# Patient Record
Sex: Female | Born: 1985 | Race: White | Hispanic: No | Marital: Married | Smoking: Never smoker
Health system: Southern US, Community
[De-identification: ages and names within clinical notes are randomized; demographics above are authoritative.]

## PROBLEM LIST (undated history)

## (undated) ENCOUNTER — Inpatient Hospital Stay (HOSPITAL_COMMUNITY): Payer: Self-pay

## (undated) DIAGNOSIS — Z9049 Acquired absence of other specified parts of digestive tract: Secondary | ICD-10-CM

## (undated) DIAGNOSIS — G43909 Migraine, unspecified, not intractable, without status migrainosus: Secondary | ICD-10-CM

## (undated) DIAGNOSIS — Z8669 Personal history of other diseases of the nervous system and sense organs: Secondary | ICD-10-CM

## (undated) DIAGNOSIS — O365990 Maternal care for other known or suspected poor fetal growth, unspecified trimester, not applicable or unspecified: Secondary | ICD-10-CM

## (undated) DIAGNOSIS — B977 Papillomavirus as the cause of diseases classified elsewhere: Secondary | ICD-10-CM

## (undated) DIAGNOSIS — Z9851 Tubal ligation status: Secondary | ICD-10-CM

## (undated) DIAGNOSIS — Z98891 History of uterine scar from previous surgery: Secondary | ICD-10-CM

## (undated) DIAGNOSIS — Z3483 Encounter for supervision of other normal pregnancy, third trimester: Secondary | ICD-10-CM

## (undated) DIAGNOSIS — K851 Biliary acute pancreatitis without necrosis or infection: Secondary | ICD-10-CM

## (undated) DIAGNOSIS — K5190 Ulcerative colitis, unspecified, without complications: Secondary | ICD-10-CM

---
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
## 2006-08-22 ENCOUNTER — Inpatient Hospital Stay (HOSPITAL_COMMUNITY): Admission: AD | Admit: 2006-08-22 | Discharge: 2006-08-26 | Payer: Self-pay | Admitting: Obstetrics and Gynecology

## 2006-09-07 ENCOUNTER — Encounter: Admission: RE | Admit: 2006-09-07 | Discharge: 2006-10-07 | Payer: Self-pay | Admitting: Obstetrics and Gynecology

## 2006-10-08 ENCOUNTER — Encounter: Admission: RE | Admit: 2006-10-08 | Discharge: 2006-11-06 | Payer: Self-pay | Admitting: Obstetrics and Gynecology

## 2006-11-07 ENCOUNTER — Encounter: Admission: RE | Admit: 2006-11-07 | Discharge: 2006-12-04 | Payer: Self-pay | Admitting: Obstetrics and Gynecology

## 2008-11-11 ENCOUNTER — Emergency Department: Payer: Self-pay | Admitting: Emergency Medicine

## 2009-10-07 ENCOUNTER — Ambulatory Visit: Payer: Self-pay | Admitting: Obstetrics & Gynecology

## 2010-06-08 NOTE — H&P (Signed)
[name], [name]               ACCOUNT NO.:  [id number]   MEDICAL RECORD NO.:  [id number]          PATIENT TYPE:  INP   LOCATION:  9166                          FACILITY:  [facility]   PHYSICIAN:  Zenaida Niece, M.D.DATE OF BIRTH:  1985/12/10   DATE OF ADMISSION:  08/22/2006  DATE OF DISCHARGE:                              HISTORY & PHYSICAL   CHIEF COMPLAINT:  Is possible intrauterine growth restriction and  decreased amniotic fluid volume.   HISTORY OF PRESENT ILLNESS:  This is a 25 year old white female gravida  1, para 0 with an EGA of [redacted] weeks who is admitted for cervical ripening  and induction due to a possible intrauterine growth restriction and low  amniotic fluid volume.  The patient started measuring size less than  dates significantly on July 15.  She had an ultrasound on July 21 which  revealed an estimated fetal weight of 2500 grams which was less than  10th percentile.  Amniotic fluid volume AFI at that point was 13.5 with  a depressed pocket of 4.3 cm.  She has been followed with reactive  nonstress tests.  She had a repeat ultrasound done for fluid volume on  July 28.  This reveals an AFI of 7.3 with a deepest pocket of 2.5 cm.  The baby is vertex.  On exam in the office today cervix is closed, thick  and high and NST is reactive.  Due to the small size of the baby as well  as a decreasing amniotic fluid volume.  I have recommended ripening and  induction and she is admitted for this at this time.  Prenatal care has  been essentially otherwise uncomplicated.   PRENATAL LABS:  Blood type is A+ with a negative antibody screen.  RPR  nonreactive, rubella immune, hepatitis B surface antigen negative, HIV  negative, gonorrhea and chlamydia negative, 1-hour Glucola is 86 and  group B strep is positive. Repeat gonorrhea and chlamydia at term is  negative, cystic fibrosis testing is also negative.   PAST GYN HISTORY:  Significant for a history of human papillomavirus.   PAST MEDICAL HISTORY:  History of ulcerative colitis.   PAST SURGICAL HISTORY:  Negative.   ALLERGIES:  None known.   CURRENT MEDICATIONS:  None.   NO FURTHER DICTATION      Zenaida Niece, M.D.     TDM/MEDQ  D:  08/22/2006  T:  08/23/2006  Job:  [id number]

## 2010-06-08 NOTE — Discharge Summary (Signed)
NAMELEYAN, BRANDEN               ACCOUNT NO.:  [id number]   MEDICAL RECORD NO.:  [id number]          PATIENT TYPE:  INP   LOCATION:  9116                          [facility]ACILITY:  [facility]   PHYSICIAN:  Malachi Pro. Ambrose Mantle, M.D. DATE OF BIRTH:  25-Oct-1985   DATE OF ADMISSION:  08/22/2006  DATE OF DISCHARGE:  08/26/2006                               DISCHARGE SUMMARY   A 25 year old  white female admitted for cervical ripening and induction  due to possible intrauterine growth restriction and low amniotic fluid  volume.  The patient underwent induction but stalled in cervical  dilatation at 3-4 cm and the occurrence of decelerations of the fetal  heart rate.  The patient underwent a low transverse cervical C-section  by Dr. Ellyn Hack  with epidural anesthesia and delivery of a 5 pound 2  ounces female infant with Apgars of nine at one and nine at 5 minutes.  She did well postoperatively,  she ambulated well, tolerated diet,  voided well, staples removed.  Strips were applied and on the third  postop day she was ready for discharge.   LABORATORY DATA:  Showed a initial hemoglobin of 12.4, hematocrit 35.9,  white count 11,800, platelet count 189,000.  Follow-up hemoglobin 11.6,  hematocrit 33.4.  RPR was nonreactive.   FINAL DIAGNOSES:  Intrauterine pregnancy 39 weeks delivered by C-  section.  A small for gestational age infant, low amniotic fluid volume,  failure to progress in labor.  Operation low transverse cervical C-  section.   FINAL CONDITION:  Improved.   DISCHARGE INSTRUCTIONS:  Instructions include our regular discharge instruction booklet.  The  patient is given a prescription for Percocet 5/325 40 tablets one or two  every 3-4 hours as needed for pain.  The patient is asked to return to  the office in 10-14 days for follow-up examination.      Malachi Pro. Ambrose Mantle, M.D.  Electronically Signed     TFH/MEDQ  D:  08/26/2006  T:  08/26/2006  Job:  [id number]

## 2010-06-08 NOTE — H&P (Signed)
[name], [name]               ACCOUNT NO.:  [id number]   MEDICAL RECORD NO.:  [id number]          PATIENT TYPE:  INP   LOCATION:  9166                          [facility]ILITY[facility]   PHYSICIAN:  Zenaida Niece, M.D.DATE OF BIRTH:  February 05, 1985   DATE OF ADMISSION:  08/22/2006  DATE OF DISCHARGE:                              HISTORY & PHYSICAL   CHIEF COMPLAINT:  Possible intrauterine growth restriction and decreased  amniotic fluid volume.   HISTORY OF PRESENT ILLNESS:  This is a 25 year old white female gravida  1, para 0 with an EGA of [redacted] weeks by a 13-week ultrasound with a due  date of August 2 who presented to the office today for follow-up of  possible IUGR and decreased amniotic fluid volume.  Prenatal care was  uncomplicated until July 15 where she measured small.  Ultrasound on  July 21 revealed estimated fetal weight of 2520 grams which was less  than 10th percentile.  Amniotic fluid index at that point was 13.5 with  a deepest pocket of 4.3 cm.  She has been followed with reactive  nonstress tests.  She had a follow-up ultrasound on July 28 which  revealed a decreased amniotic fluid index of 7.3 with a deepest pocket  of 2.5 cm.  She was seen today for a routine visit.  Her cervix is  closed, thick and high and she has no complaints.  Due to the small size  of the baby and decreasing amniotic fluid volume.  I have recommended  that she be admitted for cervical ripening and induction.  Again  prenatal care has been otherwise uncomplicated.   PRENATAL LABS:  Blood type is A+ with a negative antibody screen.  RPR  nonreactive, rubella immune, hepatitis B surface antigen negative, HIV  negative, gonorrhea and chlamydia negative.  1-hour Glucola is 86, group  B strep is negative and cystic fibrosis screen is negative.   PAST GYN HISTORY:  History of human papillomavirus.   PAST MEDICAL HISTORY:  History of ulcerative colitis.   PAST SURGICAL HISTORY:  None.   ALLERGIES:   None.   CURRENT MEDICATIONS:  None.   REVIEW OF SYSTEMS:  Is otherwise negative.   FAMILY HISTORY:  Is negative.   PHYSICAL EXAM:  VITAL SIGNS:  Weight is 200 pounds, blood pressure is  130/70.  GENERAL:  This is a gravid white female in no acute distress.  NECK:  Supple without lymphadenopathy or thyromegaly.  LUNGS:  Clear to auscultation.  HEART:  Regular rate and rhythm without murmur.  ABDOMEN:  Gravid with a fundal height of 35 cm.  Estimated fetal weight  of 5-6 pounds.  EXTREMITIES:  Have 1+ edema and are nontender.  PELVIC:  Cervix is closed, thick and high with a vertex presentation.   ASSESSMENT:  Intrauterine pregnancy at 39 weeks with possible IUGR and  decreasing amniotic fluid volume.  I have recommended the patient be  admitted for ripening and induction as her cervix is unfavorable.  The  plan is to admit the patient and ripen her cervix with Cervidil and  proceed with induction.  Zenaida Niece, M.D.  Electronically Signed     TDM/MEDQ  D:  08/22/2006  T:  08/23/2006  Job:  [id number]

## 2010-06-08 NOTE — Op Note (Signed)
[name], [name]               ACCOUNT NO.:  [id number]   MEDICAL RECORD NO.:  [id number]          PATIENT TYPE:  INP   LOCATION:  9116                          [facility]   PHYSICIAN:  Sherron Monday, MD        DATE OF BIRTH:  11/24/1985   DATE OF PROCEDURE:  08/23/2006  DATE OF DISCHARGE:                               OPERATIVE REPORT   PREOPERATIVE DIAGNOSIS:  Size less than dates, oligohydramnios, failure  to progress by arrest of dilatation.   POSTOPERATIVE DIAGNOSIS:  Size less than dates, oligohydramnios, failure  to progress by arrest of dilatation, delivered.   PROCEDURE:  Primary low transverse cesarean section.   SURGEON:  Sherron Monday, MD   ANESTHESIA:  Epidural.   COMPLICATIONS:  None.   PATHOLOGY:  None.   FINDINGS:  Viable female infant at 2114, with Apgars of 9 at one minute  and 9 at five minutes and a weight of 5 pounds 2 ounces, normal uterus,  tubes and ovaries.   ESTIMATED BLOOD LOSS:  500 mL.   IV FLUIDS:  2000 mL.   URINE OUTPUT:  200 mL clear fluid at the end of the procedure.   DISPOSITION:  Stable to PACU.   PROCEDURE:  After informed consent was reviewed with the patient  including risks, benefits and alternatives of cesarean section including  adequate contractions and arrest of dilatation as well as risks  including bleeding, infection, damage to surrounding organs, bowels,  bladder, ureters and blood vessels.  The patient was taken to the  operating room where epidural was redosed and found to be adequate.  She  was then prepped and draped in the normal sterile fashion.  Pfannenstiel  skin incision was made approximately 2 cm above the pubic symphysis  carried through the underlying layer of fascia sharply.  The fascia was  nicked in midline, the incision was extended laterally with Mayo  scissors.  The inferior aspect of the fascial incision was grasped with  Kocher clamps, elevated and the rectus muscles were dissected off both  bluntly and sharply.  Attention was then turned to the superior portion  of the incision which was grasped with Kocher clamps and the rectus  muscles were dissected off both bluntly and sharply.  Midline was easily  identified, the peritoneum was bluntly entered and the incision was  extended with good visualization with Metzenbaum scissors.  The Alexis C-  section retractor was then placed, placement was checked making sure no  bowel was trapped and the vesicouterine peritoneum was identified and  incised with Metzenbaum scissors and the bladder flap was created both  bluntly and with metzenbaum scissors.  The uterus was incised in  transverse fashion and the infant was delivered from the vertex  presentation.  A double nuchal cord was noted, the infant's nose and  mouth were suctioned on the field and handed off to awaiting pediatric  staff.  The placenta was expressed.  The uterus was cleared of all clots  and debris, closed in two layers of 0-0 Monocryl.  The first was a  running locked  suture, and the second was an imbricating layer.  Hemostasis was assured.  The peritoneal cavity was irrigated and the  Alexis retractor was removed.  Inspection of the subfascial planes  revealed they were hemostatic.  The fascia was closed with 0-0 Vicryl in  a running fashion.  Hemostasis was again assured subcuticular adipose  layer was made hemostatic with Bovie cautery.  A subcuticular adipose  layer of suture was placed.  Skin was closed with staples.  The patient  tolerated the procedure well.  Sponge, lap and needle counts were  correct x2 at the end of procedure.      Sherron Monday, MD  Electronically Signed     JB/MEDQ  D:  08/23/2006  T:  08/24/2006  Job:  [id number]

## 2010-06-11 NOTE — Consult Note (Signed)
NAMEMARGA, [name]                         ACCOUNT NO.:  [id number]   MEDICAL RECORD NO.:  [id number]                   PATIENT TYPE:  INP   LOCATION:  6127                                 FACILITY:  [facility]   PHYSICIAN:  Althea Grimmer. Luther Parody, M.D.            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  When she  initially presented to the hospital, she said it was a 9/10 in severity,  although it is somewhat less now.  Her pain is not associated with bowel  movements or relieved by bowel movements.  She has not been nauseated, other  than when she took the contrast for the CT scan that was performed today.  Reportedly, she had a prior CT scan that was negative.  The CT scan done  within the past 24 hours suggests some thickening of the right colon with  mesenteric lymph node enlargement.  She reports that she had onset of  diarrhea approximately one week ago and is having five loose, nonbloody  stools per day, at minimum.  She was evaluated by a gynecologist, by report,  and had a transvaginal ultrasound and a GYN checkup that was negative.  She  denies dysphagia, epigastric pain, weight loss.  She says she has had chills  and night sweats at home, but she has not measured her temperature.  Here in  the hospital, she has had a low-grade temperature.   PAST MEDICAL HISTORY:  Fairly unremarkable.  She does get headaches.  She  has been having some right knee discomfort.  Menstrual periods are regular,  and her last period was the end of March.   CURRENT MEDICATIONS:  1. Birth control pills.  2. Tylenol No. 3.  3. Benefiber.   ALLERGIES:  None reported.   FAMILY HISTORY:   Negative for inflammatory bowel disease.  She does have  grandparents who have had diverticulitis and a grandfather with colon  cancer.  There is no family history of Crohn's or ulcerative colitis.   SOCIAL HISTORY:  She is a high school student who reportedly does well in  school and does not seem to have a lot of social issues.   REVIEW OF SYSTEMS:  GENERAL:  No weight loss.  Positive for night sweats.  ENDOCRINE:  No known diabetes or thyroid problems.  SKIN:  No rash or  pruritus.  EYES:  No icterus or change in vision.  ENT:  No aphthous ulcers  or chronic sore throat.  RESPIRATORY:  No shortness of breath, cough, or  wheezing.  CARDIAC:  No chest pain, palpitations,  or history of valvular  heart disease.  GI:  As above.  GU:  No dysuria or hematuria.  Remainder of  review of systems is negative.   PHYSICAL EXAMINATION:  GENERAL:  She is a well-developed, uncomfortable-  appearing teenage girl, in no acute distress.  VITAL SIGNS:  Temperature is 100, blood pressure 114/70, pulse 90.  SKIN:  Normal.  EYES:  Anicteric.  OROPHARYNX:  Unremarkable without aphthous ulcers.  NECK:  Supple without thyromegaly.  There is no cervical or inguinal  adenopathy.  CHEST:  Clear.  HEART SOUNDS:  Regular rate and rhythm without murmurs, rubs, or gallops.  ABDOMEN:  Soft with mild bilateral lower quadrant tenderness.  There is no  mass or rebound.  Bowel sounds are normal.  RECTAL EXAM:  Not performed and is reported to be guaiac negative by the  resident.  EXTREMITIES:  Without clubbing, cyanosis, edema, or rash.   LABORATORY DATA:  Laboratory tests reveal a hemoglobin of 12.8, white blood  count is 7.7, but there is a leukocytosis, platelet count is mildly low at  114.  Sedimentation rate is 10, but C-reactive protein is elevated at 8.8.  Urinalysis is negative.  Potassium 3, BUN 7.  Liver function tests normal.  Stool reportedly contains white blood cells.   IMPRESSION:  A 25 year old  female with lower abdominal pain of several  months' duration with recent onset of diarrhea.  She has a mildly abnormal  computed tomography scan, suggesting inflammation of the right colon.  She  has white blood cells in the stool and an elevated C-reactive protein.  It  is doubtful that an infection would go on this long; however, _______ or  even bowel tuberculosis could present like this.  The most significant  diagnosis to rule out would be Crohn's disease or ulcerative colitis.  It is  my feeling that a colonoscopy is in order to sort this out.   PLAN:  Colonoscopy is discussed with the patient and her parents in terms of  technique, preparation, and risks and complications, including bleeding and  perforation; they agree to proceed, and it will be scheduled for tomorrow  afternoon.  Please see the orders.  Further recommendations will follow.                                               Althea Grimmer. Luther Parody, M.D.    PJS/MEDQ  D:  05/14/2002  T:  05/15/2002  Job:  [id number]   cc:   Orie Rout, M.D.  [address]  [place] [address]  Fax: [phone]   Windle Guard, M.D.  [address]  [place], [place] [address]  Fax: [phone]

## 2010-10-18 LAB — ANTIBODY SCREEN: Antibody Screen: NEGATIVE

## 2010-10-18 LAB — ABO/RH: RH Type: POSITIVE

## 2010-11-08 LAB — CCBB MATERNAL DONOR DRAW

## 2010-11-08 LAB — CBC
HCT: 35.9 — ABNORMAL LOW
MCV: 94
Platelets: 157
Platelets: 189
RDW: 12.4
RDW: 12.9
WBC: 11.8 — ABNORMAL HIGH
WBC: 15 — ABNORMAL HIGH

## 2010-11-08 LAB — RPR: RPR Ser Ql: NONREACTIVE

## 2011-01-20 LAB — HIV ANTIBODY (ROUTINE TESTING W REFLEX): HIV: NONREACTIVE

## 2011-01-25 DIAGNOSIS — Z9049 Acquired absence of other specified parts of digestive tract: Secondary | ICD-10-CM

## 2011-01-25 HISTORY — DX: Acquired absence of other specified parts of digestive tract: Z90.49

## 2011-03-29 ENCOUNTER — Encounter (HOSPITAL_COMMUNITY): Payer: Self-pay | Admitting: Pharmacist

## 2011-04-12 ENCOUNTER — Encounter (HOSPITAL_COMMUNITY): Payer: Self-pay

## 2011-04-12 ENCOUNTER — Other Ambulatory Visit (HOSPITAL_COMMUNITY): Payer: Self-pay

## 2011-04-13 ENCOUNTER — Encounter (HOSPITAL_COMMUNITY): Payer: Self-pay

## 2011-04-13 ENCOUNTER — Encounter (HOSPITAL_COMMUNITY)
Admission: RE | Admit: 2011-04-13 | Discharge: 2011-04-13 | Disposition: A | Discharge status: Home / Self Care | Payer: Medicaid Other | Source: Ambulatory Visit | Attending: Obstetrics and Gynecology | Admitting: Obstetrics and Gynecology

## 2011-04-13 LAB — CBC
HCT: 36.2 % (ref 36.0–46.0)
Hemoglobin: 12.1 g/dL (ref 12.0–15.0)
MCHC: 33.4 g/dL (ref 30.0–36.0)
MCV: 93.8 fL (ref 78.0–100.0)
RDW: 12.8 % (ref 11.5–15.5)

## 2011-04-13 NOTE — Patient Instructions (Addendum)
20 [name]  04/13/2011   Your procedure is scheduled on:  04/18/11  Enter through the Main Entrance of [facility] at 8 AM.  Pick up the phone at the desk and dial [phone].   Call this number if you have problems the morning of surgery: [phone]   Remember:   Do not eat food:After Midnight.  Do not drink clear liquids: After Midnight.  Take these medicines the morning of surgery with A SIP OF WATER: NA   Do not wear jewelry, make-up or nail polish.  Do not wear lotions, powders, or perfumes. You [place] wear deodorant.  Do not shave 48 hours prior to surgery.  Do not bring valuables to the hospital.  Contacts, dentures or bridgework [place] not be worn into surgery.  Leave suitcase in the car. After surgery it Eve be brought to your room.  For patients admitted to the hospital, checkout time is 11:00 AM the day of discharge.   Patients discharged the day of surgery will not be allowed to drive home.  Name and phone number of your driver: NA  Special Instructions: CHG Shower Use Special Wash: 1/2 bottle night before surgery and 1/2 bottle morning of surgery.   Please read over the following fact sheets that you were given: MRSA Information

## 2011-04-14 LAB — RPR: RPR Ser Ql: NONREACTIVE

## 2011-04-16 ENCOUNTER — Encounter (HOSPITAL_COMMUNITY): Payer: Self-pay | Admitting: Obstetrics and Gynecology

## 2011-04-16 DIAGNOSIS — O365990 Maternal care for other known or suspected poor fetal growth, unspecified trimester, not applicable or unspecified: Secondary | ICD-10-CM

## 2011-04-16 HISTORY — DX: Maternal care for other known or suspected poor fetal growth, unspecified trimester, not applicable or unspecified: O36.5990

## 2011-04-16 NOTE — H&P (Addendum)
Clifford Coudriet Adolph is a 26 y.o. female G2P1001 at 39+ for rLTCS.  Pt's pregnancy complicated by small for dates with growth at 17% and nl AFI, folllowed with weekly AFI and BW NST.  Also GBBS in Ur Cx.  +FM, no LOF< no VB, occ ctx.  D/W pt R/B/A of rLTCS, wishes to proceed.   Maternal Medical History:  Fetal activity: Perceived fetal activity is normal.    Prenatal complications: IUGR.     OB History    Grav Para Term Preterm Abortions TAB SAB Ect Mult Living   2 1 1       1     G1 term LTCS, oligo and small for dates, 5#2, G2 present; h/o abn pap, no h/o STDs Past Medical History  Diagnosis Date  . Ulcerative colitis   . Small for dates affecting management of mother 04/16/2011   Past Surgical History  Procedure Date  . Cesarean section 2008   Family History: Abnormal pap, COPD, CAD, DM, Diverticulitis, HTN, Migraine, MI Social History:  reports that she has never smoked. She does not have any smokeless tobacco history on file. She reports that she does not drink alcohol. Her drug history not on file.married Meds MVI All NKDA  Review of Systems  Constitutional: Negative.   HENT: Negative.   Eyes: Negative.   Respiratory: Negative.   Cardiovascular: Negative.   Gastrointestinal: Negative.   Genitourinary: Negative.   Musculoskeletal: Negative.   Skin: Negative.   Neurological: Negative.   Psychiatric/Behavioral: Negative.       Last menstrual period 07/16/2010. Maternal Exam:  Abdomen: Surgical scars: low transverse.   Fundal height is small for dates.   Estimated fetal weight is 5-6#.   Fetal presentation: vertex     Physical Exam  Constitutional: She is oriented to person, place, and time. She appears well-developed and well-nourished.  HENT:  Head: Normocephalic and atraumatic.  Neck: Normal range of motion. Neck supple. No thyromegaly present.  Cardiovascular: Normal rate and regular rhythm.   Respiratory: Effort normal and breath sounds normal. No respiratory  distress.  GI: Soft. Bowel sounds are normal. She exhibits no distension. There is no tenderness.  Musculoskeletal: Normal range of motion.  Neurological: She is alert and oriented to person, place, and time.  Skin: Skin is warm and dry.  Psychiatric: She has a normal mood and affect. Her behavior is normal.    Prenatal labs: ABO, Rh: A/Positive/-- (09/24 0000) Antibody: Negative (09/24 0000) Rubella: Immune (09/24 0000) RPR: NON REACTIVE (03/20 1153)  HBsAg: Negative (09/24 0000)  HIV: Non-reactive (12/27 0000)  GBS:   positive Hgb 13.2/Pap WNL/GBBS + in urine/ Plt 162K/ GC neg/ Chl neg/ First tri and AFP declined/ CF neg/ glucola 64  [place] nl anat, ant plac, previa [place] previa resolved [place] s<D, growth 17%,nl AFI  Assessment/Plan: 25yo G2P1001 at 39+ with small for dates pregnancy for rLTCS, after discussion of R/B/A will proceed 3/25   BOVARD,Bettyann Birchler 04/16/2011, 11:14 PM H&P reviewed, no changes.  Reviewed rLTCS inc R/B/A wil proceed.

## 2011-04-17 MED ORDER — CEFAZOLIN SODIUM-DEXTROSE 2-3 GM-% IV SOLR
2.0000 g | INTRAVENOUS | Status: AC
  Administered 2011-04-18: 2 g via INTRAVENOUS
  Filled 2011-04-17: qty 50

## 2011-04-18 ENCOUNTER — Encounter (HOSPITAL_COMMUNITY): Payer: Self-pay | Admitting: Anesthesiology

## 2011-04-18 ENCOUNTER — Inpatient Hospital Stay (HOSPITAL_COMMUNITY)
Admission: RE | Admit: 2011-04-18 | Discharge: 2011-04-20 | DRG: 765 | Disposition: A | Discharge status: Home / Self Care | Payer: Medicaid Other | Source: Ambulatory Visit | Attending: Obstetrics and Gynecology | Admitting: Obstetrics and Gynecology

## 2011-04-18 ENCOUNTER — Inpatient Hospital Stay (HOSPITAL_COMMUNITY): Payer: Medicaid Other | Admitting: Anesthesiology

## 2011-04-18 ENCOUNTER — Encounter (HOSPITAL_COMMUNITY): Payer: Self-pay | Admitting: *Deleted

## 2011-04-18 ENCOUNTER — Encounter (HOSPITAL_COMMUNITY): Payer: Self-pay | Admitting: Obstetrics and Gynecology

## 2011-04-18 ENCOUNTER — Encounter (HOSPITAL_COMMUNITY)
Admission: RE | Disposition: A | Discharge status: Home / Self Care | Payer: Self-pay | Source: Ambulatory Visit | Attending: Obstetrics and Gynecology

## 2011-04-18 DIAGNOSIS — Z22330 Carrier of Group B streptococcus: Secondary | ICD-10-CM

## 2011-04-18 DIAGNOSIS — O99892 Other specified diseases and conditions complicating childbirth: Secondary | ICD-10-CM | POA: Diagnosis present

## 2011-04-18 DIAGNOSIS — Z01818 Encounter for other preprocedural examination: Secondary | ICD-10-CM

## 2011-04-18 DIAGNOSIS — Z01812 Encounter for preprocedural laboratory examination: Secondary | ICD-10-CM

## 2011-04-18 DIAGNOSIS — Z98891 History of uterine scar from previous surgery: Secondary | ICD-10-CM

## 2011-04-18 DIAGNOSIS — O34219 Maternal care for unspecified type scar from previous cesarean delivery: Principal | ICD-10-CM | POA: Diagnosis present

## 2011-04-18 DIAGNOSIS — O365990 Maternal care for other known or suspected poor fetal growth, unspecified trimester, not applicable or unspecified: Secondary | ICD-10-CM

## 2011-04-18 HISTORY — DX: History of uterine scar from previous surgery: Z98.891

## 2011-04-18 HISTORY — DX: Maternal care for other known or suspected poor fetal growth, unspecified trimester, not applicable or unspecified: O36.5990

## 2011-04-18 SURGERY — Surgical Case
Anesthesia: Spinal

## 2011-04-18 MED ORDER — ZOLPIDEM TARTRATE 5 MG PO TABS: 5.0000 mg | ORAL_TABLET | Freq: Every evening | ORAL | Status: DC | PRN

## 2011-04-18 MED ORDER — EPHEDRINE SULFATE 50 MG/ML IJ SOLN
INTRAMUSCULAR | Status: DC | PRN
  Administered 2011-04-18 (×2): 10 mg via INTRAVENOUS

## 2011-04-18 MED ORDER — FENTANYL CITRATE 0.05 MG/ML IJ SOLN
INTRAMUSCULAR | Status: AC
  Filled 2011-04-18: qty 2

## 2011-04-18 MED ORDER — PRENATAL MULTIVITAMIN CH: 1.0000 | ORAL_TABLET | Freq: Every day | ORAL | Status: DC

## 2011-04-18 MED ORDER — OXYTOCIN 10 UNIT/ML IJ SOLN
INTRAMUSCULAR | Status: AC
  Filled 2011-04-18: qty 2

## 2011-04-18 MED ORDER — MORPHINE SULFATE (PF) 0.5 MG/ML IJ SOLN
INTRAMUSCULAR | Status: DC | PRN
  Administered 2011-04-18: .15 mg via INTRATHECAL

## 2011-04-18 MED ORDER — SCOPOLAMINE 1 MG/3DAYS TD PT72
1.0000 | MEDICATED_PATCH | Freq: Once | TRANSDERMAL | Status: DC
  Administered 2011-04-18: 1.5 mg via TRANSDERMAL

## 2011-04-18 MED ORDER — PHENYLEPHRINE 40 MCG/ML (10ML) SYRINGE FOR IV PUSH (FOR BLOOD PRESSURE SUPPORT)
PREFILLED_SYRINGE | INTRAVENOUS | Status: AC
  Filled 2011-04-18: qty 5

## 2011-04-18 MED ORDER — OXYCODONE-ACETAMINOPHEN 5-325 MG PO TABS
1.0000 | ORAL_TABLET | ORAL | Status: DC | PRN
  Administered 2011-04-19 – 2011-04-20 (×4): 1 via ORAL
  Filled 2011-04-18 (×4): qty 1

## 2011-04-18 MED ORDER — MORPHINE SULFATE 0.5 MG/ML IJ SOLN
INTRAMUSCULAR | Status: AC
  Filled 2011-04-18: qty 10

## 2011-04-18 MED ORDER — ONDANSETRON HCL 4 MG/2ML IJ SOLN: 4.0000 mg | Freq: Three times a day (TID) | INTRAMUSCULAR | Status: DC | PRN

## 2011-04-18 MED ORDER — DIPHENHYDRAMINE HCL 25 MG PO CAPS: 25.0000 mg | ORAL_CAPSULE | ORAL | Status: DC | PRN

## 2011-04-18 MED ORDER — LANOLIN HYDROUS EX OINT: 1.0000 "application " | TOPICAL_OINTMENT | CUTANEOUS | Status: DC | PRN

## 2011-04-18 MED ORDER — KETOROLAC TROMETHAMINE 30 MG/ML IJ SOLN: 30.0000 mg | Freq: Four times a day (QID) | INTRAMUSCULAR | Status: AC | PRN

## 2011-04-18 MED ORDER — TETANUS-DIPHTH-ACELL PERTUSSIS 5-2.5-18.5 LF-MCG/0.5 IM SUSP: 0.5000 mL | Freq: Once | INTRAMUSCULAR | Status: DC

## 2011-04-18 MED ORDER — NALOXONE HCL 0.4 MG/ML IJ SOLN
1.0000 ug/kg/h | INTRAMUSCULAR | Status: DC | PRN
  Filled 2011-04-18: qty 2.5

## 2011-04-18 MED ORDER — SENNOSIDES-DOCUSATE SODIUM 8.6-50 MG PO TABS
2.0000 | ORAL_TABLET | Freq: Every day | ORAL | Status: DC
  Administered 2011-04-18 – 2011-04-19 (×2): 2 via ORAL

## 2011-04-18 MED ORDER — DIPHENHYDRAMINE HCL 50 MG/ML IJ SOLN: 25.0000 mg | INTRAMUSCULAR | Status: DC | PRN

## 2011-04-18 MED ORDER — MENTHOL 3 MG MT LOZG: 1.0000 | LOZENGE | OROMUCOSAL | Status: DC | PRN

## 2011-04-18 MED ORDER — SODIUM CHLORIDE 0.9 % IJ SOLN: 3.0000 mL | INTRAMUSCULAR | Status: DC | PRN

## 2011-04-18 MED ORDER — KETOROLAC TROMETHAMINE 30 MG/ML IJ SOLN
INTRAMUSCULAR | Status: AC
  Filled 2011-04-18: qty 1

## 2011-04-18 MED ORDER — PHENYLEPHRINE HCL 10 MG/ML IJ SOLN
INTRAMUSCULAR | Status: DC | PRN
  Administered 2011-04-18 (×2): 80 ug via INTRAVENOUS
  Administered 2011-04-18: 40 ug via INTRAVENOUS
  Administered 2011-04-18: 80 ug via INTRAVENOUS

## 2011-04-18 MED ORDER — NALBUPHINE HCL 10 MG/ML IJ SOLN
5.0000 mg | INTRAMUSCULAR | Status: DC | PRN
  Filled 2011-04-18: qty 1

## 2011-04-18 MED ORDER — METOCLOPRAMIDE HCL 5 MG/ML IJ SOLN: 10.0000 mg | Freq: Three times a day (TID) | INTRAMUSCULAR | Status: DC | PRN

## 2011-04-18 MED ORDER — NALOXONE HCL 0.4 MG/ML IJ SOLN: 0.4000 mg | INTRAMUSCULAR | Status: DC | PRN

## 2011-04-18 MED ORDER — ONDANSETRON HCL 4 MG/2ML IJ SOLN
INTRAMUSCULAR | Status: AC
  Filled 2011-04-18: qty 2

## 2011-04-18 MED ORDER — DIBUCAINE 1 % RE OINT: 1.0000 "application " | TOPICAL_OINTMENT | RECTAL | Status: DC | PRN

## 2011-04-18 MED ORDER — KETOROLAC TROMETHAMINE 30 MG/ML IJ SOLN
INTRAMUSCULAR | Status: DC | PRN
  Administered 2011-04-18: 30 mg via INTRAVENOUS

## 2011-04-18 MED ORDER — BUPIVACAINE IN DEXTROSE 0.75-8.25 % IT SOLN
INTRATHECAL | Status: DC | PRN
  Administered 2011-04-18: 1.5 mL via INTRATHECAL

## 2011-04-18 MED ORDER — PRENATAL MULTIVITAMIN CH
1.0000 | ORAL_TABLET | Freq: Every morning | ORAL | Status: DC
  Administered 2011-04-19 – 2011-04-20 (×2): 1 via ORAL
  Filled 2011-04-18 (×2): qty 1

## 2011-04-18 MED ORDER — FENTANYL CITRATE 0.05 MG/ML IJ SOLN: 25.0000 ug | INTRAMUSCULAR | Status: DC | PRN

## 2011-04-18 MED ORDER — IBUPROFEN 600 MG PO TABS
600.0000 mg | ORAL_TABLET | Freq: Four times a day (QID) | ORAL | Status: DC | PRN
  Filled 2011-04-18: qty 1

## 2011-04-18 MED ORDER — DIPHENHYDRAMINE HCL 25 MG PO CAPS: 25.0000 mg | ORAL_CAPSULE | Freq: Four times a day (QID) | ORAL | Status: DC | PRN

## 2011-04-18 MED ORDER — LACTATED RINGERS IV SOLN
INTRAVENOUS | Status: DC
  Administered 2011-04-18: 125 mL/h via INTRAVENOUS

## 2011-04-18 MED ORDER — ONDANSETRON HCL 4 MG/2ML IJ SOLN: 4.0000 mg | INTRAMUSCULAR | Status: DC | PRN

## 2011-04-18 MED ORDER — OXYTOCIN 20 UNITS IN LACTATED RINGERS INFUSION - SIMPLE: 125.0000 mL/h | INTRAVENOUS | Status: AC

## 2011-04-18 MED ORDER — DEXAMETHASONE SODIUM PHOSPHATE 10 MG/ML IJ SOLN
INTRAMUSCULAR | Status: AC
  Filled 2011-04-18: qty 1

## 2011-04-18 MED ORDER — ONDANSETRON HCL 4 MG PO TABS: 4.0000 mg | ORAL_TABLET | ORAL | Status: DC | PRN

## 2011-04-18 MED ORDER — SIMETHICONE 80 MG PO CHEW: 80.0000 mg | CHEWABLE_TABLET | ORAL | Status: DC | PRN

## 2011-04-18 MED ORDER — FENTANYL CITRATE 0.05 MG/ML IJ SOLN
INTRAMUSCULAR | Status: DC | PRN
  Administered 2011-04-18: 25 ug via INTRATHECAL

## 2011-04-18 MED ORDER — SIMETHICONE 80 MG PO CHEW
80.0000 mg | CHEWABLE_TABLET | Freq: Three times a day (TID) | ORAL | Status: DC
  Administered 2011-04-18 – 2011-04-20 (×6): 80 mg via ORAL

## 2011-04-18 MED ORDER — ONDANSETRON HCL 4 MG/2ML IJ SOLN
INTRAMUSCULAR | Status: DC | PRN
  Administered 2011-04-18: 4 mg via INTRAVENOUS

## 2011-04-18 MED ORDER — LACTATED RINGERS IV SOLN
INTRAVENOUS | Status: DC
Start: 2011-04-18 — End: 2011-04-18
  Administered 2011-04-18: 08:00:00 via INTRAVENOUS

## 2011-04-18 MED ORDER — MEPERIDINE HCL 25 MG/ML IJ SOLN
INTRAMUSCULAR | Status: AC
  Filled 2011-04-18: qty 1

## 2011-04-18 MED ORDER — DIPHENHYDRAMINE HCL 50 MG/ML IJ SOLN: 12.5000 mg | INTRAMUSCULAR | Status: DC | PRN

## 2011-04-18 MED ORDER — OXYTOCIN 20 UNITS IN LACTATED RINGERS INFUSION - SIMPLE
INTRAVENOUS | Status: DC | PRN
  Administered 2011-04-18 (×2): 20 [IU] via INTRAVENOUS

## 2011-04-18 MED ORDER — IBUPROFEN 800 MG PO TABS
800.0000 mg | ORAL_TABLET | Freq: Three times a day (TID) | ORAL | Status: DC
  Administered 2011-04-18 – 2011-04-20 (×5): 800 mg via ORAL
  Filled 2011-04-18 (×5): qty 1

## 2011-04-18 MED ORDER — MEPERIDINE HCL 25 MG/ML IJ SOLN: 6.2500 mg | INTRAMUSCULAR | Status: DC | PRN

## 2011-04-18 MED ORDER — DEXAMETHASONE SODIUM PHOSPHATE 10 MG/ML IJ SOLN
INTRAMUSCULAR | Status: DC | PRN
  Administered 2011-04-18: 10 mg via INTRAVENOUS

## 2011-04-18 MED ORDER — WITCH HAZEL-GLYCERIN EX PADS: 1.0000 "application " | MEDICATED_PAD | CUTANEOUS | Status: DC | PRN

## 2011-04-18 SURGICAL SUPPLY — 28 items
CHLORAPREP W/TINT 26ML (MISCELLANEOUS) ×2 IMPLANT
CLOTH BEACON ORANGE TIMEOUT ST (SAFETY) ×2 IMPLANT
CONTAINER PREFILL 10% NBF 15ML (MISCELLANEOUS) IMPLANT
ELECT REM PT RETURN 9FT ADLT (ELECTROSURGICAL) ×2
ELECTRODE REM PT RTRN 9FT ADLT (ELECTROSURGICAL) ×1 IMPLANT
EXTRACTOR VACUUM M CUP 4 TUBE (SUCTIONS) IMPLANT
GLOVE BIO SURGEON STRL SZ 6.5 (GLOVE) ×2 IMPLANT
GLOVE BIO SURGEON STRL SZ7 (GLOVE) ×2 IMPLANT
GOWN PREVENTION PLUS LG XLONG (DISPOSABLE) ×6 IMPLANT
KIT ABG SYR 3ML LUER SLIP (SYRINGE) IMPLANT
NEEDLE HYPO 25X5/8 SAFETYGLIDE (NEEDLE) IMPLANT
NS IRRIG 1000ML POUR BTL (IV SOLUTION) ×2 IMPLANT
PACK C SECTION WH (CUSTOM PROCEDURE TRAY) ×2 IMPLANT
RTRCTR C-SECT PINK 25CM LRG (MISCELLANEOUS) ×2 IMPLANT
SLEEVE SCD COMPRESS KNEE MED (MISCELLANEOUS) IMPLANT
STAPLER VISISTAT 35W (STAPLE) IMPLANT
SUT MNCRL 0 VIOLET CTX 36 (SUTURE) ×2 IMPLANT
SUT MONOCRYL 0 CTX 36 (SUTURE) ×2
SUT PLAIN 1 NONE 54 (SUTURE) IMPLANT
SUT PLAIN 2 0 XLH (SUTURE) ×2 IMPLANT
SUT VIC AB 0 CT1 27 (SUTURE) ×2
SUT VIC AB 0 CT1 27XBRD ANBCTR (SUTURE) ×2 IMPLANT
SUT VIC AB 2-0 CT1 27 (SUTURE) ×1
SUT VIC AB 2-0 CT1 TAPERPNT 27 (SUTURE) ×1 IMPLANT
SYR BULB IRRIGATION 50ML (SYRINGE) ×2 IMPLANT
TOWEL OR 17X24 6PK STRL BLUE (TOWEL DISPOSABLE) ×4 IMPLANT
TRAY FOLEY CATH 14FR (SET/KITS/TRAYS/PACK) IMPLANT
WATER STERILE IRR 1000ML POUR (IV SOLUTION) ×2 IMPLANT

## 2011-04-18 NOTE — Anesthesia Postprocedure Evaluation (Signed)
  Anesthesia Post-op Note  Patient: [name]  Procedure(s) Performed: Procedure(s) (LRB): CESAREAN SECTION (N/A)  Patient Location: PACU  Anesthesia Type: Spinal  Level of Consciousness: awake, alert  and oriented  Airway and Oxygen Therapy: Patient Spontanous Breathing  Post-op Pain: none  Post-op Assessment: Post-op Vital signs reviewed, Patient's Cardiovascular Status Stable, Respiratory Function Stable, Patent Airway, No signs of Nausea or vomiting, Pain level controlled, No headache and No backache  Post-op Vital Signs: Reviewed and stable  Complications: No apparent anesthesia complications

## 2011-04-18 NOTE — Op Note (Signed)
[name], [name]                  ACCOUNT NO.:  [id number]  MEDICAL RECORD NO.:  [id number]  LOCATION:  [facility]                          FACILITY:  [facility]  PHYSICIAN:  Sherron Monday, MD        DATE OF BIRTH:  January 24, 1986  DATE OF PROCEDURE:  04/18/2011 DATE OF DISCHARGE:                              OPERATIVE REPORT   PREOPERATIVE DIAGNOSES: 1. Intrauterine pregnancy at 39 weeks. 2. History of low-transverse cesarean section. 3. Declined vaginal birth after cesarean.  POSTOPERATIVE DIAGNOSES: 1. Intrauterine pregnancy at 39 weeks. 2. History of low-transverse cesarean section. 3. Declined vaginal birth after cesarean. 4. Delivered.  PROCEDURE:  Repeat low transverse cesarean section.  SURGEON:  Sherron Monday, MD.  ASSISTANT:  Zenaida Niece, MD.  ANESTHESIA:  Spinal.  ESTIMATED BLOOD LOSS:  750.  IV FLUIDS:  3000.  URINE OUTPUT:  600 mL at the end of the case with clear urine.  SPECIMENS:  Placenta, cord blood in Labor and Delivery.  COMPLICATIONS:  None.  FINDINGS:  Viable female infant born at 9:48 with Apgars 8 at 1 minute and 9 at 5 minutes, and weight pending at the time of dictation.  Normal uterus, tubes, and ovaries with small filmy adhesions.  PROCEDURE IN DETAIL:  After informed consent was reviewed with the patient including risks, benefits, and alternatives of the surgical procedure, she was transported to the operating room, where spinal anesthesia was placed and found to be adequate, she was then placed on the table in supine position with a leftward tilt, prepped and draped in the normal sterile fashion.  Foley catheter was sterilely placed.  Her Pfannenstiel skin incision was made at the level of her previous incision, 2 fingerbreadths above the pubic symphysis, carried through the underlying layer of fascia sharply.  The fascia was incised in the midline.  Incision was extended laterally with Mayo scissors.  Inferior aspect of the fascial incision was  grasped with Kocher clamps and elevated.  Rectus muscles were dissected off both bluntly and sharply.  Attention was then turned to the superior portion, which in a similar fashion was grasped with Kocher clamps and elevated.  The rectus muscles were dissected off both bluntly and sharply.  Peritoneum was entered bluntly.  Incision extended superiorly and inferiorly with good visualization of the bladder.  The Alexis skin retractor was placed carefully making sure no bowel was entrapped.  The uterus was examined. The vesicouterine peritoneum was easily identified and tented up.  A bladder flap was created with smooth pickups.  With blunt and sharp dissection, uterus was incised in a transverse fashion.  Infant was delivered from a vertex presentation.  Nose and mouth were suctioned on the field.  Cord was clamped and cut.  Infant was handed off to the awaiting pediatric staff.  The placenta was expressed from the uterus. The uterus was cleared of all clots and debris.  Uterine incision was closed in 2 layers of 0 Monocryl, the first of which was running locked and the second as an imbricating layer.  The incision was made hemostatic with figure-of-eight of 0 Vicryl.  Copious pelvic irrigation was performed.  The [employer] skin  retractor was removed and the peritoneum was reapproximated after subfascial planes were inspected and found to be hemostatic.  The peritoneum was reapproximated with 2-0 Vicryl in a running fashion.  The fascia was closed with 0 Vicryl in a running fashion from either edge meeting in the middle.  The subcuticular adipose layer was irrigated and made hemostatic with Bovie cautery.  The subcuticular adipose tissue dead space was closed with 3-0 plain gut and the skin was closed with 3-0 Vicryl in the subcuticular fashion. Benzoin and Steri-Strips were applied.  The patient tolerated the procedure well.  Sponge, lap, and needle counts were correct x2 per the operating  room staff.     Sherron Monday, MD     JB/MEDQ  D:  04/18/2011  T:  04/18/2011  Job:  [id number]

## 2011-04-18 NOTE — Anesthesia Postprocedure Evaluation (Signed)
  Anesthesia Post-op Note  Patient: [name]  Procedure(s) Performed: Procedure(s) (LRB): CESAREAN SECTION (N/A)  Patient Location: Mother/Baby  Anesthesia Type: Spinal  Level of Consciousness: awake  Airway and Oxygen Therapy: Patient Spontanous Breathing  Post-op Pain: mild  Post-op Assessment: Post-op Vital signs reviewed  Post-op Vital Signs: Reviewed and stable  Complications: No apparent anesthesia complications

## 2011-04-18 NOTE — Anesthesia Preprocedure Evaluation (Addendum)
Anesthesia Evaluation    Airway Mallampati: I TM Distance: >3 FB Neck ROM: Full    Dental No notable dental hx. (+) Teeth Intact   Pulmonary neg pulmonary ROS,    Pulmonary exam normal       Cardiovascular negative cardio ROS  Rhythm:Regular Rate:Normal     Neuro/Psych negative neurological ROS  negative psych ROS   GI/Hepatic Neg liver ROS, Ulcerative colitis   Endo/Other  Morbid obesity  Renal/GU negative Renal ROS  negative genitourinary   Musculoskeletal   Abdominal Normal abdominal exam  (+) + obese,   Peds  Hematology negative hematology ROS (+)   Anesthesia Other Findings   Reproductive/Obstetrics (+) Pregnancy                          Anesthesia Physical Anesthesia Plan  ASA: III  Anesthesia Plan: Spinal   Post-op Pain Management:    Induction:   Airway Management Planned: Natural Airway  Additional Equipment:   Intra-op Plan:   Post-operative Plan:   Informed Consent: I have reviewed the patients History and Physical, chart, labs and discussed the procedure including the risks, benefits and alternatives for the proposed anesthesia with the patient or authorized representative who has indicated his/her understanding and acceptance.     Plan Discussed with: Anesthesiologist, Surgeon and CRNA  Anesthesia Plan Comments:         Anesthesia Quick Evaluation

## 2011-04-18 NOTE — Addendum Note (Signed)
Addendum  created 04/18/11 1721 by Algis Greenhouse, CRNA   Modules edited:Notes Section

## 2011-04-18 NOTE — Transfer of Care (Signed)
Immediate Anesthesia Transfer of Care Note  Patient: [name]  Procedure(s) Performed: Procedure(s) (LRB): CESAREAN SECTION (N/A)  Patient Location: PACU  Anesthesia Type: Spinal  Level of Consciousness: awake, alert  and oriented  Airway & Oxygen Therapy: Patient Spontanous Breathing  Post-op Assessment: Report given to PACU RN and Post -op Vital signs reviewed and stable  Post vital signs: Reviewed and stable  Complications: No apparent anesthesia complications

## 2011-04-18 NOTE — Anesthesia Procedure Notes (Signed)
Spinal  Patient location during procedure: OR Start time: 04/18/2011 9:31 AM Staffing Anesthesiologist: Ahna Konkle A. Performed by: anesthesiologist  Preanesthetic Checklist Completed: patient identified, site marked, surgical consent, pre-op evaluation, timeout performed, IV checked, risks and benefits discussed and monitors and equipment checked Spinal Block Patient position: sitting Prep: site prepped and draped and DuraPrep Patient monitoring: heart rate, cardiac monitor, continuous pulse ox and blood pressure Approach: midline Location: L3-4 Injection technique: single-shot Needle Needle type: Sprotte  Needle gauge: 24 G Needle length: 9 cm Needle insertion depth: 6 cm Assessment Sensory level: T4 Additional Notes Patient tolerated procedure well. Adequate sensory level.

## 2011-04-18 NOTE — Brief Op Note (Signed)
04/18/2011  10:32 AM  PATIENT:  [name]  25 y.o. female  PRE-OPERATIVE DIAGNOSIS:  Previous Cesarean Section  POST-OPERATIVE DIAGNOSIS:  Previous Cesarean Section  PROCEDURE:  Procedure(s) (LRB): CESAREAN SECTION (N/A)  SURGEON:  Surgeon(s) and Role:    * Sherron Monday, MD - Primary    * Lavina Hamman, MD - Assisting   ANESTHESIA:   spinal  EBL:  Total I/O In: 3000 [I.V.:3000] Out: 1350 [Urine:600; Blood:750]  SPECIMEN:  Source of Specimen:  placenta  DISPOSITION OF SPECIMEN:  cord blood and L&D  COUNTS:  YES  DICTATION: .Other Dictation: Dictation Number [id number]  PLAN OF CARE: Admit to inpatient   PATIENT DISPOSITION:  PACU - hemodynamically stable.   Delay start of Pharmacological VTE agent (>24hrs) due to surgical blood loss or risk of bleeding: no

## 2011-04-18 NOTE — Preoperative (Signed)
Beta Blockers   Reason not to administer Beta Blockers:Not Applicable 

## 2011-04-19 ENCOUNTER — Encounter (HOSPITAL_COMMUNITY): Payer: Self-pay | Admitting: Obstetrics and Gynecology

## 2011-04-19 LAB — CBC
MCH: 31.2 pg (ref 26.0–34.0)
MCV: 93.4 fL (ref 78.0–100.0)
Platelets: 183 10*3/uL (ref 150–400)
RBC: 3.17 MIL/uL — ABNORMAL LOW (ref 3.87–5.11)
RDW: 12.8 % (ref 11.5–15.5)
WBC: 17.2 10*3/uL — ABNORMAL HIGH (ref 4.0–10.5)

## 2011-04-19 NOTE — Progress Notes (Signed)
UR chart review completed.  

## 2011-04-19 NOTE — Progress Notes (Signed)
Subjective: Postpartum Day 1: Cesarean Delivery Patient reports incisional pain and tolerating PO.  Pain controlled, nl lochia  Objective: Vital signs in last 24 hours: Temp:  [97.8 F (36.6 C)-99 F (37.2 C)] 98.4 F (36.9 C) (03/26 0607) Pulse Rate:  [60-85] 66  (03/26 0607) Resp:  [16-20] 20  (03/26 0607) BP: (97-130)/(53-78) 102/64 mmHg (03/26 0607) SpO2:  [91 %-99 %] 94 % (03/26 0607) Weight:  [99.791 kg (220 lb)] 99.791 kg (220 lb) (03/25 1237)  Physical Exam:  General: alert and no distress Lochia: appropriate Uterine Fundus: firm Incision: healing well DVT Evaluation: No evidence of DVT seen on physical exam.   Basename 04/19/11 0520  HGB 9.9*  HCT 29.6*    Assessment/Plan: Status post Cesarean section. Doing well postoperatively.  Continue current care.  BOVARD,Aubrie Lucien 04/19/2011, 9:16 AM

## 2011-04-20 MED ORDER — IBUPROFEN 800 MG PO TABS: 800.0000 mg | ORAL_TABLET | Freq: Three times a day (TID) | ORAL | Status: AC

## 2011-04-20 MED ORDER — PRENATAL MULTIVITAMIN CH: 1.0000 | ORAL_TABLET | Freq: Every morning | ORAL | Status: DC

## 2011-04-20 MED ORDER — OXYCODONE-ACETAMINOPHEN 5-325 MG PO TABS: 1.0000 | ORAL_TABLET | ORAL | Status: AC | PRN

## 2011-04-20 NOTE — Progress Notes (Signed)
Subjective: Postpartum Day 1: Cesarean Delivery Patient reports incisional pain, tolerating PO and no problems voiding.  Nl lochia, pain controlled  Objective: Vital signs in last 24 hours: Temp:  [97.8 F (36.6 C)-98.9 F (37.2 C)] 98.2 F (36.8 C) (03/27 0541) Pulse Rate:  [63-80] 76  (03/27 0541) Resp:  [18-20] 18  (03/27 0541) BP: (103-114)/(64-75) 110/75 mmHg (03/27 0541) SpO2:  [97 %-98 %] 98 % (03/26 1405)  Physical Exam:  General: alert and no distress Lochia: appropriate Uterine Fundus: firm Incision: healing well DVT Evaluation: No evidence of DVT seen on physical exam.   Basename 04/19/11 0520  HGB 9.9*  HCT 29.6*    Assessment/Plan: Status post Cesarean section. Doing well postoperatively.  Discharge home with standard precautions and return to clinic in 2 weeks.  D/c with motrin/percocet/ pnv  BOVARD,Jolan Upchurch 04/20/2011, 8:14 AM

## 2011-04-20 NOTE — Discharge Summary (Signed)
Obstetric Discharge Summary Reason for Admission: cesarean section Prenatal Procedures: none Intrapartum Procedures: cesarean: low cervical, transverse Postpartum Procedures: none Complications-Operative and Postpartum: none Hemoglobin  Date Value Range Status  04/19/2011 9.9* 12.0-15.0 (g/dL) Final     HCT  Date Value Range Status  04/19/2011 29.6* 36.0-46.0 (%) Final    Physical Exam:  General: alert and no distress Lochia: appropriate Uterine Fundus: firm Incision: healing well DVT Evaluation: No evidence of DVT seen on physical exam.  Discharge Diagnoses: Term Pregnancy-delivered  Discharge Information: Date: 04/20/2011 Activity: pelvic rest Diet: routine Medications: PNV, Ibuprofen and Percocet Condition: stable Instructions: refer to practice specific booklet Discharge to: home Follow-up Information    Follow up with [name],Fatin Bachicha, MD. Schedule an appointment as soon as possible for a visit in 2 weeks.   Contact information:   [address] [place] [address] [phone]          Newborn Data: Live born female  Birth Weight: 6 lb 14.4 oz (3130 g) APGAR: 8, 9  Home with mother.  [name],[name] 04/20/2011, 8:18 AM

## 2011-05-31 ENCOUNTER — Encounter (HOSPITAL_COMMUNITY): Payer: Self-pay | Admitting: *Deleted

## 2011-05-31 ENCOUNTER — Emergency Department (HOSPITAL_COMMUNITY)
Admission: EM | Admit: 2011-05-31 | Discharge: 2011-05-31 | Disposition: A | Discharge status: Home / Self Care | Payer: Self-pay | Attending: Emergency Medicine | Admitting: Emergency Medicine

## 2011-05-31 ENCOUNTER — Emergency Department (HOSPITAL_COMMUNITY): Payer: Self-pay

## 2011-05-31 DIAGNOSIS — K859 Acute pancreatitis without necrosis or infection, unspecified: Secondary | ICD-10-CM | POA: Insufficient documentation

## 2011-05-31 DIAGNOSIS — R10819 Abdominal tenderness, unspecified site: Secondary | ICD-10-CM | POA: Insufficient documentation

## 2011-05-31 DIAGNOSIS — K8020 Calculus of gallbladder without cholecystitis without obstruction: Secondary | ICD-10-CM | POA: Insufficient documentation

## 2011-05-31 DIAGNOSIS — K851 Biliary acute pancreatitis without necrosis or infection: Secondary | ICD-10-CM

## 2011-05-31 LAB — COMPREHENSIVE METABOLIC PANEL
ALT: 30 U/L (ref 0–35)
AST: 35 U/L (ref 0–37)
Albumin: 4.8 g/dL (ref 3.5–5.2)
Alkaline Phosphatase: 94 U/L (ref 39–117)
BUN: 13 mg/dL (ref 6–23)
Chloride: 101 mEq/L (ref 96–112)
Potassium: 3.5 mEq/L (ref 3.5–5.1)
Total Bilirubin: 0.6 mg/dL (ref 0.3–1.2)

## 2011-05-31 LAB — DIFFERENTIAL
Basophils Relative: 0 % (ref 0–1)
Monocytes Relative: 6 % (ref 3–12)
Neutro Abs: 12.1 10*3/uL — ABNORMAL HIGH (ref 1.7–7.7)
Neutrophils Relative %: 83 % — ABNORMAL HIGH (ref 43–77)

## 2011-05-31 LAB — LIPASE, BLOOD: Lipase: >3000 U/L — ABNORMAL HIGH (ref 11–59)

## 2011-05-31 LAB — URINALYSIS, ROUTINE W REFLEX MICROSCOPIC
Bilirubin Urine: NEGATIVE
Glucose, UA: NEGATIVE mg/dL
Hgb urine dipstick: NEGATIVE
Ketones, ur: 40 mg/dL — AB
pH: 7 (ref 5.0–8.0)

## 2011-05-31 LAB — URINE MICROSCOPIC-ADD ON

## 2011-05-31 LAB — CBC
Hemoglobin: 15.3 g/dL — ABNORMAL HIGH (ref 12.0–15.0)
MCHC: 34.6 g/dL (ref 30.0–36.0)
RBC: 4.86 MIL/uL (ref 3.87–5.11)

## 2011-05-31 MED ORDER — MORPHINE SULFATE 4 MG/ML IJ SOLN
4.0000 mg | Freq: Once | INTRAMUSCULAR | Status: AC
  Administered 2011-05-31: 4 mg via INTRAVENOUS
  Filled 2011-05-31: qty 1

## 2011-05-31 MED ORDER — ALUM & MAG HYDROXIDE-SIMETH 200-200-20 MG/5ML PO SUSP
15.0000 mL | Freq: Once | ORAL | Status: AC
  Administered 2011-05-31: 15 mL via ORAL
  Filled 2011-05-31: qty 30

## 2011-05-31 MED ORDER — LIDOCAINE VISCOUS 2 % MT SOLN
20.0000 mL | Freq: Once | OROMUCOSAL | Status: AC
  Administered 2011-05-31: 20 mL via OROMUCOSAL
  Filled 2011-05-31: qty 15

## 2011-05-31 MED ORDER — OXYCODONE HCL 5 MG PO TABS: 5.0000 mg | ORAL_TABLET | Freq: Four times a day (QID) | ORAL | Status: DC | PRN

## 2011-05-31 MED ORDER — GI COCKTAIL ~~LOC~~
30.0000 mL | Freq: Once | ORAL | Status: DC
  Filled 2011-05-31: qty 30

## 2011-05-31 MED ORDER — ONDANSETRON HCL 4 MG/2ML IJ SOLN
4.0000 mg | Freq: Once | INTRAMUSCULAR | Status: AC
  Administered 2011-05-31: 4 mg via INTRAVENOUS
  Filled 2011-05-31: qty 2

## 2011-05-31 NOTE — ED Notes (Signed)
Pt unable to provide urine specimen at this time

## 2011-05-31 NOTE — ED Provider Notes (Signed)
History     CSN: [id number]  Arrival date & time 05/31/11  1407   First MD Initiated Contact with Patient 05/31/11 1632      Chief Complaint  Patient presents with  . Abdominal Pain  . Nausea    (Consider location/radiation/quality/duration/timing/severity/associated sxs/prior treatment) Patient is a 26 y.o. female presenting with abdominal pain. The history is provided by the patient.  Abdominal Pain The primary symptoms of the illness include abdominal pain, nausea and vomiting. The primary symptoms of the illness do not include fever, shortness of breath, diarrhea, dysuria, vaginal discharge or vaginal bleeding. The current episode started 3 to 5 hours ago. The onset of the illness was sudden. The problem has not changed since onset. The abdominal pain is located in the epigastric region. The abdominal pain radiates to the back and LUQ. The severity of the abdominal pain is 8/10. The abdominal pain is relieved by nothing. The abdominal pain is exacerbated by movement.  The patient states that she believes she is currently not pregnant. Symptoms associated with the illness do not include chills, urgency, hematuria or frequency. Significant associated medical issues include inflammatory bowel disease.    Past Medical History  Diagnosis Date  . Ulcerative colitis   . Small for dates affecting management of mother 04/16/2011  . S/P cesarean section 04/18/2011    Past Surgical History  Procedure Date  . Cesarean section 2008  . Cesarean section 04/18/2011    Procedure: CESAREAN SECTION;  Surgeon: Sherron Monday, MD;  Location: [facility];  Service: Gynecology;  Laterality: N/A;    No family history on file.  History  Substance Use Topics  . Smoking status: Never Smoker   . Smokeless tobacco: Not on file  . Alcohol Use: No    OB History    Grav Para Term Preterm Abortions TAB SAB Ect Mult Living   2 2 2       2       Review of Systems  Constitutional: Negative for fever and  chills.  Respiratory: Negative for cough, chest tightness and shortness of breath.   Cardiovascular: Negative for chest pain and leg swelling.  Gastrointestinal: Positive for nausea, vomiting and abdominal pain. Negative for diarrhea.  Genitourinary: Negative for dysuria, urgency, frequency, hematuria, vaginal bleeding and vaginal discharge.  All other systems reviewed and are negative.    Allergies  Review of patient's allergies indicates no known allergies.  Home Medications   Current Outpatient Rx  Name Route Sig Dispense Refill  . FENUGREEK PO Oral Take 2 tablets by mouth 3 (three) times daily.    Marland Kitchen PRENATAL MULTIVITAMIN CH Oral Take 1 tablet by mouth daily.      BP 122/79  Pulse 81  Temp(Src) 98.1 F (36.7 C) (Oral)  Resp 16  Ht 5\' 3"  (1.6 m)  Wt 200 lb (90.719 kg)  BMI 35.43 kg/m2  SpO2 98%  Breastfeeding? Unknown  Physical Exam  Nursing note and vitals reviewed. Constitutional: She is oriented to person, place, and time. She appears well-developed and well-nourished.  HENT:  Head: Normocephalic and atraumatic.  Eyes: EOM are normal.  Neck: Normal range of motion.  Cardiovascular: Normal rate, regular rhythm and normal heart sounds.   Pulmonary/Chest: Effort normal and breath sounds normal. No respiratory distress.  Abdominal: Soft. There is tenderness in the right upper quadrant, epigastric area and left upper quadrant. There is no rigidity, no guarding and no CVA tenderness.    Musculoskeletal: Normal range of motion.  Neurological: She is  alert and oriented to person, place, and time.  Skin: Skin is warm and dry.  Psychiatric: She has a normal mood and affect.    ED Course  Procedures (including critical care time)  Labs Reviewed  CBC - Abnormal; Notable for the following:    WBC 14.5 (*)    Hemoglobin 15.3 (*)    All other components within normal limits  DIFFERENTIAL - Abnormal; Notable for the following:    Neutrophils Relative 83 (*)    Neutro  Abs 12.1 (*)    Lymphocytes Relative 10 (*)    All other components within normal limits  LIPASE, BLOOD - Abnormal; Notable for the following:    Lipase >3000 (*) RESULT CONFIRMED BY AUTOMATED DILUTION   All other components within normal limits  URINALYSIS, ROUTINE W REFLEX MICROSCOPIC - Abnormal; Notable for the following:    Ketones, ur 40 (*)    Leukocytes, UA TRACE (*)    All other components within normal limits  COMPREHENSIVE METABOLIC PANEL  PREGNANCY, URINE  URINE MICROSCOPIC-ADD ON   US Abdomen Complete  05/31/2011  *RADIOLOGY REPORT*  Clinical Data:  Abdominal pain.  Nausea.  COMPLETE ABDOMINAL ULTRASOUND  Comparison:  08/19/2003 CT.  Findings:  Gallbladder:  Small gallstones are present within the gallbladder. These appear adherent to the gallbladder wall.  Gallbladder wall measures 3 mm, normal.  There are no striations.  No pericholecystic fluid.  No sonographic Murphy's sign. Largest gallstone measures 11 mm.  Common bile duct:  4 mm, normal.  Liver:  No focal lesion identified.  Within normal limits in parenchymal echogenicity.  IVC:  Appears normal.  Pancreas:  No focal abnormality seen.  Spleen:  7.9 cm.  Normal echotexture.  Right Kidney:  10.7 cm. Normal echotexture.  Normal central sinus echo complex.  No calculi or hydronephrosis.  Left Kidney:  10.0 cm. Normal echotexture.  Normal central sinus echo complex.  No calculi or hydronephrosis.  Abdominal aorta:  No aneurysm identified.  IMPRESSION: Cholelithiasis without sonographic findings of cholecystitis.  Original Report Authenticated By: Andreas Newport, M.D.     1. Pancreatitis   2. Gallstones   3. Gallstone pancreatitis       MDM   Patient is a 26 year old female who is approximately 6 weeks postpartum status post low transverse C-section. She presents with relatively sudden onset of epigastric pain approximately 4 hours ago. It began about 30 minutes after eating. She describes the pain as radiating to her back as  well as her left upper quadrant. She has associated nausea and has vomited several times. She has had similar pain over the course of the last couple weeks, often worse after eating. She states his pain was much worse than prior and she had increased vomiting.  On evaluation she does have epigastric tenderness as well as left upper quadrant tenderness, but also has moderate right upper quadrant tenderness without Murphy sign. Given the relationship to food, prior symptoms, some consideration of biliary colic.  Pt also with h/o UC, but has had no recent flares. This pain is completely different. That typically is dull, mild in in lower abd. She has no lower abd pain.   6:39 PM On repeat assessment, but much improved. Repeat exam with mild epigastric tenderness. She remains without peritoneal signs or Murphy sign. Awaiting ultrasound.    8:08 PM Lipase returned significantly elevated at greater than 3000. CMP within normal limits. Ultrasound did demonstrate gallstones without evidence of cholecystitis. Repeat exam remained benign and patient's pain  was greatly improved. She was able to tolerate PO without any difficulty thus feel her stable for outpatient management. Discuss sparse use of narcotics given her breast feeding status. She has seen [facility] gastroenterology previously and is to followup with them closely. Recommend discussion with her OB/GYN doctor regarding pain medicines that can be used. She was on oxycodone after her C-section and this is a grade B lactation status thus will use this.  Donnamarie Poag, MD 05/31/11 2340

## 2011-05-31 NOTE — ED Notes (Signed)
Pt to US.

## 2011-05-31 NOTE — ED Notes (Signed)
Pt. Discharged to home, NAD noted 

## 2011-05-31 NOTE — ED Notes (Signed)
Pt c/o epigastric pain radiating to back since this morning that occurred after eating. Pt reports 4 episodes of vomiting following. Vomiting has resolved, nausea continues. Pt is 6 weeks postpartum and reports similar episodes during pregnancy. Pt resting with husband at bedside

## 2011-05-31 NOTE — ED Notes (Signed)
Patient reports she had onset of mid abd pain that radiates into her back with nausea 1 hour after eating.  She continues to complain of pain and nausea.  Patient denies diarrhea.  Patient is 6 weeks postpartum

## 2011-06-01 NOTE — ED Provider Notes (Signed)
I saw and evaluated the patient, reviewed the resident's note and I agree with the findings and plan. Patient with new pancreatitis. Likely biliary pancreatitis. Patient is tolerated orals and her pain is controlled. She is breast-feeding. She'll be discharged home to followup with her gastroenterologist.  Juliet Rude. Rubin Payor, MD 06/01/11 [phone]

## 2011-06-02 ENCOUNTER — Emergency Department (HOSPITAL_COMMUNITY): Payer: Medicaid Other

## 2011-06-02 ENCOUNTER — Inpatient Hospital Stay (HOSPITAL_COMMUNITY)
Admission: EM | Admit: 2011-06-02 | Discharge: 2011-06-07 | DRG: 418 | Disposition: A | Discharge status: Home / Self Care | Payer: Medicaid Other | Attending: General Surgery | Admitting: General Surgery

## 2011-06-02 ENCOUNTER — Encounter (HOSPITAL_COMMUNITY): Payer: Self-pay

## 2011-06-02 DIAGNOSIS — K8040 Calculus of bile duct with cholecystitis, unspecified, without obstruction: Secondary | ICD-10-CM | POA: Diagnosis present

## 2011-06-02 DIAGNOSIS — K851 Biliary acute pancreatitis without necrosis or infection: Secondary | ICD-10-CM | POA: Diagnosis present

## 2011-06-02 DIAGNOSIS — K859 Acute pancreatitis without necrosis or infection, unspecified: Principal | ICD-10-CM | POA: Diagnosis present

## 2011-06-02 HISTORY — DX: Biliary acute pancreatitis without necrosis or infection: K85.10

## 2011-06-02 HISTORY — DX: Migraine, unspecified, not intractable, without status migrainosus: G43.909

## 2011-06-02 LAB — CBC
HCT: 44.2 % (ref 36.0–46.0)
Hemoglobin: 14.6 g/dL (ref 12.0–15.0)
RBC: 4.78 MIL/uL (ref 3.87–5.11)
WBC: 13.8 10*3/uL — ABNORMAL HIGH (ref 4.0–10.5)

## 2011-06-02 LAB — URINALYSIS, ROUTINE W REFLEX MICROSCOPIC
Glucose, UA: NEGATIVE mg/dL
Hgb urine dipstick: NEGATIVE
Specific Gravity, Urine: 1.015 (ref 1.005–1.030)

## 2011-06-02 LAB — URINE MICROSCOPIC-ADD ON

## 2011-06-02 LAB — DIFFERENTIAL
Lymphocytes Relative: 12 % (ref 12–46)
Monocytes Absolute: 1 10*3/uL (ref 0.1–1.0)
Monocytes Relative: 7 % (ref 3–12)
Neutro Abs: 11.1 10*3/uL — ABNORMAL HIGH (ref 1.7–7.7)
Neutrophils Relative %: 80 % — ABNORMAL HIGH (ref 43–77)

## 2011-06-02 LAB — COMPREHENSIVE METABOLIC PANEL
AST: 49 U/L — ABNORMAL HIGH (ref 0–37)
Alkaline Phosphatase: 92 U/L (ref 39–117)
BUN: 13 mg/dL (ref 6–23)
CO2: 24 mEq/L (ref 19–32)
Chloride: 102 mEq/L (ref 96–112)
Creatinine, Ser: 0.85 mg/dL (ref 0.50–1.10)
GFR calc non Af Amer: >90 mL/min (ref >90)
Potassium: 3.9 mEq/L (ref 3.5–5.1)
Total Bilirubin: 1.2 mg/dL (ref 0.3–1.2)

## 2011-06-02 LAB — LIPASE, BLOOD: Lipase: >3000 U/L — ABNORMAL HIGH (ref 11–59)

## 2011-06-02 MED ORDER — IOHEXOL 300 MG/ML  SOLN
20.0000 mL | INTRAMUSCULAR | Status: AC
  Administered 2011-06-02 (×2): 20 mL via ORAL

## 2011-06-02 MED ORDER — HYDROMORPHONE HCL PF 1 MG/ML IJ SOLN
INTRAMUSCULAR | Status: AC
  Administered 2011-06-02: 1 mg via INTRAVENOUS
  Filled 2011-06-02: qty 1

## 2011-06-02 MED ORDER — HYDROMORPHONE HCL PF 1 MG/ML IJ SOLN
1.0000 mg | Freq: Once | INTRAMUSCULAR | Status: AC
  Administered 2011-06-02: 1 mg via INTRAVENOUS
  Filled 2011-06-02: qty 1

## 2011-06-02 MED ORDER — POTASSIUM CHLORIDE IN NACL 20-0.9 MEQ/L-% IV SOLN
INTRAVENOUS | Status: DC
  Administered 2011-06-03 – 2011-06-06 (×12): via INTRAVENOUS
  Filled 2011-06-02 (×20): qty 1000

## 2011-06-02 MED ORDER — SODIUM CHLORIDE 0.9 % IJ SOLN: 9.0000 mL | INTRAMUSCULAR | Status: DC | PRN

## 2011-06-02 MED ORDER — HYDROMORPHONE 0.3 MG/ML IV SOLN
INTRAVENOUS | Status: DC
  Administered 2011-06-02: 1.4 mg via INTRAVENOUS
  Administered 2011-06-02: 2.7 mg via INTRAVENOUS
  Administered 2011-06-02: 19:00:00 via INTRAVENOUS
  Administered 2011-06-03: 3 mg via INTRAVENOUS
  Administered 2011-06-03: 12:00:00 via INTRAVENOUS
  Administered 2011-06-03: 4.8 mg via INTRAVENOUS
  Administered 2011-06-03: 3 mL via INTRAVENOUS
  Administered 2011-06-03: 4.46 mg via INTRAVENOUS
  Administered 2011-06-03: 02:00:00 via INTRAVENOUS
  Administered 2011-06-03: 3 mg via INTRAVENOUS
  Administered 2011-06-04: 0.6 mg via INTRAVENOUS
  Administered 2011-06-04: 3.9 mg via INTRAVENOUS
  Administered 2011-06-04: 1.5 mg via INTRAVENOUS
  Administered 2011-06-04: 15:00:00 via INTRAVENOUS
  Administered 2011-06-04: 1.8 mg via INTRAVENOUS
  Administered 2011-06-04: 03:00:00 via INTRAVENOUS
  Administered 2011-06-04: 3 mg via INTRAVENOUS
  Administered 2011-06-05: 1.8 mg via INTRAVENOUS
  Administered 2011-06-05: 08:00:00 via INTRAVENOUS
  Administered 2011-06-05: 2.4 mg via INTRAVENOUS
  Administered 2011-06-05: 0.6 mg via INTRAVENOUS
  Administered 2011-06-05: 1.2 mg via INTRAVENOUS
  Administered 2011-06-06: 01:00:00 via INTRAVENOUS
  Administered 2011-06-06: 1.2 mg via INTRAVENOUS
  Administered 2011-06-06: 2.6 mg via INTRAVENOUS
  Administered 2011-06-06: 0.6 mg via INTRAVENOUS
  Filled 2011-06-02 (×7): qty 25

## 2011-06-02 MED ORDER — PANTOPRAZOLE SODIUM 40 MG IV SOLR
40.0000 mg | Freq: Every day | INTRAVENOUS | Status: DC
  Administered 2011-06-02 – 2011-06-05 (×4): 40 mg via INTRAVENOUS
  Filled 2011-06-02 (×5): qty 40

## 2011-06-02 MED ORDER — HYDROMORPHONE HCL PF 1 MG/ML IJ SOLN
1.0000 mg | Freq: Once | INTRAMUSCULAR | Status: AC
  Administered 2011-06-02: 1 mg via INTRAVENOUS

## 2011-06-02 MED ORDER — DIPHENHYDRAMINE HCL 50 MG/ML IJ SOLN: 12.5000 mg | Freq: Four times a day (QID) | INTRAMUSCULAR | Status: DC | PRN

## 2011-06-02 MED ORDER — DIPHENHYDRAMINE HCL 12.5 MG/5ML PO ELIX
12.5000 mg | ORAL_SOLUTION | Freq: Four times a day (QID) | ORAL | Status: DC | PRN
  Filled 2011-06-02: qty 5

## 2011-06-02 MED ORDER — SODIUM CHLORIDE 0.9 % IV BOLUS (SEPSIS)
1000.0000 mL | Freq: Once | INTRAVENOUS | Status: AC
  Administered 2011-06-02: 1000 mL via INTRAVENOUS

## 2011-06-02 MED ORDER — MORPHINE SULFATE 4 MG/ML IJ SOLN
4.0000 mg | Freq: Once | INTRAMUSCULAR | Status: AC
  Administered 2011-06-02: 4 mg via INTRAVENOUS
  Filled 2011-06-02: qty 1

## 2011-06-02 MED ORDER — PROMETHAZINE HCL 25 MG/ML IJ SOLN
25.0000 mg | Freq: Once | INTRAMUSCULAR | Status: AC
  Administered 2011-06-02: 25 mg via INTRAVENOUS
  Filled 2011-06-02: qty 1

## 2011-06-02 MED ORDER — DIPHENHYDRAMINE HCL 12.5 MG/5ML PO ELIX
12.5000 mg | ORAL_SOLUTION | Freq: Four times a day (QID) | ORAL | Status: DC | PRN
  Filled 2011-06-02: qty 10

## 2011-06-02 MED ORDER — ONDANSETRON HCL 4 MG/2ML IJ SOLN: 4.0000 mg | Freq: Four times a day (QID) | INTRAMUSCULAR | Status: DC | PRN

## 2011-06-02 MED ORDER — ONDANSETRON HCL 4 MG/2ML IJ SOLN
4.0000 mg | Freq: Once | INTRAMUSCULAR | Status: AC
  Administered 2011-06-02: 4 mg via INTRAVENOUS
  Filled 2011-06-02: qty 2

## 2011-06-02 MED ORDER — PROMETHAZINE HCL 25 MG/ML IJ SOLN
12.5000 mg | Freq: Four times a day (QID) | INTRAMUSCULAR | Status: DC | PRN
Start: 2011-06-02 — End: 2011-06-07

## 2011-06-02 MED ORDER — NALOXONE HCL 0.4 MG/ML IJ SOLN: 0.4000 mg | INTRAMUSCULAR | Status: DC | PRN

## 2011-06-02 MED ORDER — HYDROMORPHONE HCL PF 1 MG/ML IJ SOLN
0.5000 mg | INTRAMUSCULAR | Status: DC | PRN
  Administered 2011-06-02: 2 mg via INTRAVENOUS
  Filled 2011-06-02: qty 2

## 2011-06-02 MED ORDER — IOHEXOL 300 MG/ML  SOLN
100.0000 mL | Freq: Once | INTRAMUSCULAR | Status: AC | PRN
  Administered 2011-06-02: 100 mL via INTRAVENOUS

## 2011-06-02 NOTE — H&P (Signed)
I saw the patient, participated in the history, exam and medical decision making, and concur with the physician assistant's note above.  Gallstone pancreatitis Chronic cholecystitis. GB doesn't appear to be actively inflammed. Will hold on IV abx for now. Plan pancreas rest first followed later by cholecystectomy. Will follow labs.   D/w Pt and family and shown diagram of pertinent anatomy and discussed management of GS pancreatitis.  Mary Sella. Andrey Campanile, MD, FACS General, Bariatric, & Minimally Invasive Surgery [facility], [place]

## 2011-06-02 NOTE — Consult Note (Signed)
Reason for Consult: Gallstone pancreatitis Referring Physician: Hospital team  [name] is an 26 y.o. female.  HPI: Patient seen and examined and the case discussed with her mother and her husband. Patient 6 weeks postpartum who is still breast-feeding her second child who had minimal reflux during pregnancy and a few mild bouts of pain after giving birth but had increased pain and nausea and vomiting 2 days ago and presented to the emergency room and was found to have gallstone pancreatitis but was sent home to followup with our office and she had an appointment today however she had increased pain and return to the emergency room and her labs were about the same and she was admitted for further workup and plans. Her mother had her gallbladder out but other than diverticular disease nothing else runs in the family and she does not drink or smoke and her medicines were reviewed. She's had 2 colonoscopies before for her ulcerative colitis but has been in remission for at least 4-5 years and has not been seen in our office since then Past Medical History  Diagnosis Date  . Ulcerative colitis   . Small for dates affecting management of mother 04/16/2011  . S/P cesarean section 04/18/2011    Past Surgical History  Procedure Date  . Cesarean section 2008  . Cesarean section 04/18/2011    Procedure: CESAREAN SECTION;  Surgeon: Sherron Monday, MD;  Location: [facility];  Service: Gynecology;  Laterality: N/A;    History reviewed. No pertinent family history.  Social History:  reports that she has never smoked. She does not have any smokeless tobacco history on file. She reports that she does not drink alcohol or use illicit drugs.  Allergies: No Known Allergies  Medications: I have reviewed the patient's current medications.  Results for orders placed during the hospital encounter of 06/02/11 (from the past 48 hour(s))  CBC     Status: Abnormal   Collection Time   06/02/11 12:22 PM      Component  Value Range Comment   WBC 13.8 (*) 4.0 - 10.5 (K/uL)    RBC 4.78  3.87 - 5.11 (MIL/uL)    Hemoglobin 14.6  12.0 - 15.0 (g/dL)    HCT 16.1  09.6 - 04.5 (%)    MCV 92.5  78.0 - 100.0 (fL)    MCH 30.5  26.0 - 34.0 (pg)    MCHC 33.0  30.0 - 36.0 (g/dL)    RDW 40.9  81.1 - 91.4 (%)    Platelets 213  150 - 400 (K/uL)   DIFFERENTIAL     Status: Abnormal   Collection Time   06/02/11 12:22 PM      Component Value Range Comment   Neutrophils Relative 80 (*) 43 - 77 (%)    Neutro Abs 11.1 (*) 1.7 - 7.7 (K/uL)    Lymphocytes Relative 12  12 - 46 (%)    Lymphs Abs 1.6  0.7 - 4.0 (K/uL)    Monocytes Relative 7  3 - 12 (%)    Monocytes Absolute 1.0  0.1 - 1.0 (K/uL)    Eosinophils Relative 1  0 - 5 (%)    Eosinophils Absolute 0.2  0.0 - 0.7 (K/uL)    Basophils Relative 0  0 - 1 (%)    Basophils Absolute 0.0  0.0 - 0.1 (K/uL)   COMPREHENSIVE METABOLIC PANEL     Status: Abnormal   Collection Time   06/02/11 12:22 PM  Component Value Range Comment   Sodium 141  135 - 145 (mEq/L)    Potassium 3.9  3.5 - 5.1 (mEq/L)    Chloride 102  96 - 112 (mEq/L)    CO2 24  19 - 32 (mEq/L)    Glucose, Bld 98  70 - 99 (mg/dL)    BUN 13  6 - 23 (mg/dL)    Creatinine, Ser 1.61  0.50 - 1.10 (mg/dL)    Calcium 9.6  8.4 - 10.5 (mg/dL)    Total Protein 7.6  6.0 - 8.3 (g/dL)    Albumin 4.5  3.5 - 5.2 (g/dL)    AST 49 (*) 0 - 37 (U/L)    ALT 45 (*) 0 - 35 (U/L)    Alkaline Phosphatase 92  39 - 117 (U/L)    Total Bilirubin 1.2  0.3 - 1.2 (mg/dL)    GFR calc non Af Amer >90  >90 (mL/min)    GFR calc Af Amer >90  >90 (mL/min)   LIPASE, BLOOD     Status: Abnormal   Collection Time   06/02/11 12:22 PM      Component Value Range Comment   Lipase >3000 (*) 11 - 59 (U/L) RESULT CONFIRMED BY AUTOMATED DILUTION  PREGNANCY, URINE     Status: Normal   Collection Time   06/02/11  2:58 PM      Component Value Range Comment   Preg Test, Ur NEGATIVE  NEGATIVE    URINALYSIS, ROUTINE W REFLEX MICROSCOPIC     Status: Abnormal     Collection Time   06/02/11  2:58 PM      Component Value Range Comment   Color, Urine YELLOW  YELLOW     APPearance CLEAR  CLEAR     Specific Gravity, Urine 1.015  1.005 - 1.030     pH 5.5  5.0 - 8.0     Glucose, UA NEGATIVE  NEGATIVE (mg/dL)    Hgb urine dipstick NEGATIVE  NEGATIVE     Bilirubin Urine NEGATIVE  NEGATIVE     Ketones, ur 40 (*) NEGATIVE (mg/dL)    Protein, ur NEGATIVE  NEGATIVE (mg/dL)    Urobilinogen, UA 0.2  0.0 - 1.0 (mg/dL)    Nitrite NEGATIVE  NEGATIVE     Leukocytes, UA TRACE (*) NEGATIVE    URINE MICROSCOPIC-ADD ON     Status: Normal   Collection Time   06/02/11  2:58 PM      Component Value Range Comment   Squamous Epithelial / LPF RARE  RARE     WBC, UA 0-2  <3 (WBC/hpf)    Bacteria, UA RARE  RARE      US Abdomen Complete  05/31/2011  *RADIOLOGY REPORT*  Clinical Data:  Abdominal pain.  Nausea.  COMPLETE ABDOMINAL ULTRASOUND  Comparison:  08/19/2003 CT.  Findings:  Gallbladder:  Small gallstones are present within the gallbladder. These appear adherent to the gallbladder wall.  Gallbladder wall measures 3 mm, normal.  There are no striations.  No pericholecystic fluid.  No sonographic Murphy's sign. Largest gallstone measures 11 mm.  Common bile duct:  4 mm, normal.  Liver:  No focal lesion identified.  Within normal limits in parenchymal echogenicity.  IVC:  Appears normal.  Pancreas:  No focal abnormality seen.  Spleen:  7.9 cm.  Normal echotexture.  Right Kidney:  10.7 cm. Normal echotexture.  Normal central sinus echo complex.  No calculi or hydronephrosis.  Left Kidney:  10.0 cm. Normal echotexture.  Normal central  sinus echo complex.  No calculi or hydronephrosis.  Abdominal aorta:  No aneurysm identified.  IMPRESSION: Cholelithiasis without sonographic findings of cholecystitis.  Original Report Authenticated By: Andreas Newport, M.D.   Ct Abdomen Pelvis W Contrast  06/02/2011  *RADIOLOGY REPORT*  Clinical Data: Right upper quadrant abdominal pain.  Markedly  elevated serum lipase and mildly elevated liver function tests. Leukocytosis.  History of ulcerative colitis.  6 weeks post-partum.  CT ABDOMEN AND PELVIS WITH CONTRAST 06/02/2011:  Technique:  Multidetector CT imaging of the abdomen and pelvis was performed following the standard protocol during bolus administration of intravenous contrast.  Contrast: OMNIPAQUE IOHEXOL 300 MG/ML. Oral contrast was also administered.  Comparison: CT abdomen and pelvis 08/19/2003 and 05/14/2002.  Findings: Edema involving the head, uncinate, and body of the pancreas with associated peripancreatic edema/inflammation.  The distal tail is uninvolved.  Normal pancreatic enhancement throughout.  No cysts within the pancreas.  No free fluid within the abdomen or pelvis.  Normal appearing liver, spleen, adrenal glands, and kidneys. Thickened gallbladder wall up to approximately 5-6 mm without calcified gallstones.  No biliary ductal dilation.  Filling defect within the intrapancreatic portion of the distal common bile duct. No visible aorto-iliofemoral atherosclerosis. Scattered normal- sized retroperitoneal lymph nodes in the abdomen; no significant lymphadenopathy.  Normal appearing stomach, small bowel, and colon.  Large stool burden.  Cecum positioned in the right mid abdomen, and normal appearing retrocecal appendix identified.  Uterus and ovaries unremarkable by CT.  Urinary bladder unremarkable.  Bone window images demonstrate a vacuum phenomenon in the sacroiliac joints but are otherwise unremarkable. Visualized lung bases clear apart from the expected dependent atelectasis posteriorly.  Heart size normal.  IMPRESSION:  1.  Acute pancreatitis involving the head, uncinate, and body of the pancreas.  No evidence of pancreatic necrosis.  No pseudocyst. 2.  Gallstone suspected within the distal common bile duct, likely the cause of the acute pancreatitis. 3.  Gallbladder wall thickening up to approximately 6 mm is consistent with  chronic cholecystitis, in the absence of pericholecystic edema/inflammation at this time. 4.  Large stool burden.  Original Report Authenticated By: Arnell Sieving, M.D.    ROS negative except above Blood pressure 118/63, pulse 73, temperature 97.8 F (36.6 C), temperature source Oral, resp. rate 16, SpO2 97.00%. Physical Exam vital signs stable afebrile no acute distress sclera nonicteric lungs are clear regular heart regular rate and rhythm abdomen is soft with only minimal pain to palpation in the upper quadrant and no lower discomfort and decreased but present bowel sounds and labs and CT and ultrasound were reviewed  Assessment/Plan: Gallstone pancreatitis with questionable CBD stone on CT Plan: We briefly discussed and endoscopic procedure if we thought a stone was in the CBD however if it seemingly passes clinically we can wait till her pancreatitis decreases and then proceed with cholecystectomy and Intra-Op cholangiogram. Will hold antibiotics for now but the surgeons Arkin start them after reviewing the CT and I discussed the case with them as well and will check on in the morning and decide how to proceed and repeat labs in the a.m. as well. The warnings a breast-feeding while taking medicines were discussed and possibly she'll need an EUS or MRCP if needed  [facility] E 06/02/2011, 6:14 PM

## 2011-06-02 NOTE — ED Notes (Signed)
Recent dx with pancreatitis, sts no improvement in pain and gi told her to come here if it is worse.

## 2011-06-02 NOTE — ED Provider Notes (Signed)
Medical screening examination/treatment/procedure(s) were conducted as a shared visit with non-physician practitioner(s) and myself.  I personally evaluated the patient during the encounter Pt with recent findings of gallstone pancreatitis and d/ced home but continues to have abd pain and vomiting.  Persistent pancreatitis today and pt admitted to surgery  Gwyneth Sprout, MD 06/02/11 2134

## 2011-06-02 NOTE — H&P (Signed)
[name] 08-Sep-1985  [id number].   Primary GI: Dr. Charlott Rakes Chief Complaint/Reason for Consult: gallstone pancreatitis HPI: This is a 26 yo female who is 6 weeks postpartum who has had several small episodes of epigastric abdominal pain.  Two were during her pregnancy.  The last one started on Tuesday morning and was severe in nature.  It was located in her epigastrium and the radiated to her back and around the left side of her abdomen.  She came to the [facility] where she would found to have gallstone pancreatitis with a lipase over 3000, but for some reason was sent home with GI follow up.  Her pain continued at home and she continued to have nausea and vomiting.  She represented to the [facility] today and was found to still have a lipase of over 3000.  She had an U/S on Tuesday that revealed gallstones. Her LFTs are slightly elevated today with AST and ALT in the 40s.  She is currently awaiting a CT scan.  We have been asked to see for further evaluation and admission.    Review of Systems: Please see HPI, otherwise all other systems are negative.  She is breast feeding.  History reviewed. No pertinent family history.  Past Medical History  Diagnosis Date  . Ulcerative colitis   . Small for dates affecting management of mother 04/16/2011  . S/P cesarean section 04/18/2011    Past Surgical History  Procedure Date  . Cesarean section 2008  . Cesarean section 04/18/2011    Procedure: CESAREAN SECTION;  Surgeon: Sherron Monday, MD;  Location: [facility];  Service: Gynecology;  Laterality: N/A;    Social History:  reports that she has never smoked. She does not have any smokeless tobacco history on file. She reports that she does not drink alcohol or use illicit drugs.  Allergies: No Known Allergies   (Not in a hospital admission)  Blood pressure 127/72, pulse 73, temperature 97.8 F (36.6 C), temperature source Oral, resp. rate 16, SpO2 96.00%. Physical Exam: General: pleasant, WD, WN white  female who is laying in bed in mild distress secondary to pain HEENT: head is normocephalic, atraumatic.  Sclera are noninjected.  PERRL.  Ears and nose without any masses or lesions.  Mouth is pink and moist Heart: regular, rate, and rhythm.  Normal s1,s2. No obvious murmurs, gallops, or rubs noted.  Palpable radial and pedal pulses bilaterally Lungs: CTAB, no wheezes, rhonchi, or rales noted.  Respiratory effort nonlabored Abd: soft, quite tender in her epigastrium, LUQ.  She has a minimal amount of discomfort in the RUQ, ND,  Hypoactive BS, no masses, hernias, or organomegaly, pfannenstiel incision noted.  MS: all 4 extremities are symmetrical with no cyanosis, clubbing, or edema. Skin: warm and dry with no masses, lesions, or rashes Psych: A&Ox3 with an appropriate affect.    Results for orders placed during the hospital encounter of 06/02/11 (from the past 48 hour(s))  CBC     Status: Abnormal   Collection Time   06/02/11 12:22 PM      Component Value Range Comment   WBC 13.8 (*) 4.0 - 10.5 (K/uL)    RBC 4.78  3.87 - 5.11 (MIL/uL)    Hemoglobin 14.6  12.0 - 15.0 (g/dL)    HCT 40.9  81.1 - 91.4 (%)    MCV 92.5  78.0 - 100.0 (fL)    MCH 30.5  26.0 - 34.0 (pg)    MCHC 33.0  30.0 - 36.0 (g/dL)  RDW 11.7  11.5 - 15.5 (%)    Platelets 213  150 - 400 (K/uL)   DIFFERENTIAL     Status: Abnormal   Collection Time   06/02/11 12:22 PM      Component Value Range Comment   Neutrophils Relative 80 (*) 43 - 77 (%)    Neutro Abs 11.1 (*) 1.7 - 7.7 (K/uL)    Lymphocytes Relative 12  12 - 46 (%)    Lymphs Abs 1.6  0.7 - 4.0 (K/uL)    Monocytes Relative 7  3 - 12 (%)    Monocytes Absolute 1.0  0.1 - 1.0 (K/uL)    Eosinophils Relative 1  0 - 5 (%)    Eosinophils Absolute 0.2  0.0 - 0.7 (K/uL)    Basophils Relative 0  0 - 1 (%)    Basophils Absolute 0.0  0.0 - 0.1 (K/uL)   COMPREHENSIVE METABOLIC PANEL     Status: Abnormal   Collection Time   06/02/11 12:22 PM      Component Value Range Comment     Sodium 141  135 - 145 (mEq/L)    Potassium 3.9  3.5 - 5.1 (mEq/L)    Chloride 102  96 - 112 (mEq/L)    CO2 24  19 - 32 (mEq/L)    Glucose, Bld 98  70 - 99 (mg/dL)    BUN 13  6 - 23 (mg/dL)    Creatinine, Ser 1.91  0.50 - 1.10 (mg/dL)    Calcium 9.6  8.4 - 10.5 (mg/dL)    Total Protein 7.6  6.0 - 8.3 (g/dL)    Albumin 4.5  3.5 - 5.2 (g/dL)    AST 49 (*) 0 - 37 (U/L)    ALT 45 (*) 0 - 35 (U/L)    Alkaline Phosphatase 92  39 - 117 (U/L)    Total Bilirubin 1.2  0.3 - 1.2 (mg/dL)    GFR calc non Af Amer >90  >90 (mL/min)    GFR calc Af Amer >90  >90 (mL/min)   LIPASE, BLOOD     Status: Abnormal   Collection Time   06/02/11 12:22 PM      Component Value Range Comment   Lipase >3000 (*) 11 - 59 (U/L) RESULT CONFIRMED BY AUTOMATED DILUTION  PREGNANCY, URINE     Status: Normal   Collection Time   06/02/11  2:58 PM      Component Value Range Comment   Preg Test, Ur NEGATIVE  NEGATIVE    URINALYSIS, ROUTINE W REFLEX MICROSCOPIC     Status: Abnormal   Collection Time   06/02/11  2:58 PM      Component Value Range Comment   Color, Urine YELLOW  YELLOW     APPearance CLEAR  CLEAR     Specific Gravity, Urine 1.015  1.005 - 1.030     pH 5.5  5.0 - 8.0     Glucose, UA NEGATIVE  NEGATIVE (mg/dL)    Hgb urine dipstick NEGATIVE  NEGATIVE     Bilirubin Urine NEGATIVE  NEGATIVE     Ketones, ur 40 (*) NEGATIVE (mg/dL)    Protein, ur NEGATIVE  NEGATIVE (mg/dL)    Urobilinogen, UA 0.2  0.0 - 1.0 (mg/dL)    Nitrite NEGATIVE  NEGATIVE     Leukocytes, UA TRACE (*) NEGATIVE    URINE MICROSCOPIC-ADD ON     Status: Normal   Collection Time   06/02/11  2:58 PM  Component Value Range Comment   Squamous Epithelial / LPF RARE  RARE     WBC, UA 0-2  <3 (WBC/hpf)    Bacteria, UA RARE  RARE     US Abdomen Complete  05/31/2011  *RADIOLOGY REPORT*  Clinical Data:  Abdominal pain.  Nausea.  COMPLETE ABDOMINAL ULTRASOUND  Comparison:  08/19/2003 CT.  Findings:  Gallbladder:  Small gallstones are present  within the gallbladder. These appear adherent to the gallbladder wall.  Gallbladder wall measures 3 mm, normal.  There are no striations.  No pericholecystic fluid.  No sonographic Murphy's sign. Largest gallstone measures 11 mm.  Common bile duct:  4 mm, normal.  Liver:  No focal lesion identified.  Within normal limits in parenchymal echogenicity.  IVC:  Appears normal.  Pancreas:  No focal abnormality seen.  Spleen:  7.9 cm.  Normal echotexture.  Right Kidney:  10.7 cm. Normal echotexture.  Normal central sinus echo complex.  No calculi or hydronephrosis.  Left Kidney:  10.0 cm. Normal echotexture.  Normal central sinus echo complex.  No calculi or hydronephrosis.  Abdominal aorta:  No aneurysm identified.  IMPRESSION: Cholelithiasis without sonographic findings of cholecystitis.  Original Report Authenticated By: Andreas Newport, M.D.       Assessment/Plan 1. Gallstone pancreatitis 2. 6 weeks postpartum  Plan: 1. We will admit the patient and make her NPO and give her bowel rest.  She will be started on IVFs at 250cc/hr acutely for her pancreatitis.  We will recheck labs in the morning.  Her u/s on Tuesday did not show any evidence of cholecystitis.  I have a low suspicion for cholecystitis based off of her clinical exam, but if the CT scan mentions a possibility then we will look at placing the patient on abx therapy.  Otherwise, we will hold off for now.  She will be given a breast pump.  Her Dilaudid is minimal infant risk as well as zofran and phenergan.  As of now, she does not need to pump and dump, but if she notices any side effects such as drowsiness of her infant, then she Guilliams want to consider dumping her breast milk.  We will await the CT scan to see what her pancreas looks like.  We will follow labs to make sure her LFTs do not start trending upwards to make sure she doesn't need an ERCP.  GI is already on board and following.  We will plan surgical intervention once her pancreatitis  resolves.  Daxson Reffett E 06/02/2011, 5:04 PM

## 2011-06-02 NOTE — ED Notes (Signed)
Attempted to call report - nurse unavailable at this time - secretary states nurse will call me back

## 2011-06-02 NOTE — ED Notes (Signed)
CT staff made aware of pt's completion of PO contrast  

## 2011-06-02 NOTE — ED Notes (Signed)
Ice chips given per pt request

## 2011-06-02 NOTE — ED Notes (Signed)
Pt knows that urine is needed 

## 2011-06-02 NOTE — ED Provider Notes (Signed)
History     CSN: [id number]  Arrival date & time 06/02/11  1135   First MD Initiated Contact with Patient 06/02/11 1149      Chief Complaint  Patient presents with  . Abdominal Pain    (Consider location/radiation/quality/duration/timing/severity/associated sxs/prior treatment) HPI Comments: Patient comes in today with a chief complaint of epigastric pain.  She was last seen in the ED for this same complaint 2 days ago.  At that time an abdominal ultrasound showed gallstone and labs showed an elevated lipase.  Therefore, she was diagnosed with gallstone pancreatitis and instructed to follow up with GI.  She has an appointment with Dr. Bosie Clos scheduled today at 3:45 pm.  She reports that after being discharged 2 days ago her pain improved, but then returned again this morning.  She reports that the pain today is worse than the pain that she was having two days ago.  She has also vomited several times today.  Patient is 6 weeks postpartum and is currently breast feeding.  She also has a history of Ulcerative Colitis, but reports that she has not had a flare up in 7 years.  Patient is a 26 y.o. female presenting with abdominal pain. The history is provided by the patient.  Abdominal Pain The primary symptoms of the illness include abdominal pain, nausea and vomiting. The primary symptoms of the illness do not include fever, shortness of breath, diarrhea, hematemesis or dysuria.  Symptoms associated with the illness do not include chills, constipation, hematuria or back pain.    Past Medical History  Diagnosis Date  . Ulcerative colitis   . Small for dates affecting management of mother 04/16/2011  . S/P cesarean section 04/18/2011    Past Surgical History  Procedure Date  . Cesarean section 2008  . Cesarean section 04/18/2011    Procedure: CESAREAN SECTION;  Surgeon: Sherron Monday, MD;  Location: [facility];  Service: Gynecology;  Laterality: N/A;    History reviewed. No pertinent family  history.  History  Substance Use Topics  . Smoking status: Never Smoker   . Smokeless tobacco: Not on file  . Alcohol Use: No    OB History    Grav Para Term Preterm Abortions TAB SAB Ect Mult Living   2 2 2       2       Review of Systems  Constitutional: Negative for fever and chills.  Respiratory: Negative for shortness of breath.   Gastrointestinal: Positive for nausea, vomiting and abdominal pain. Negative for diarrhea, constipation, abdominal distention and hematemesis.  Genitourinary: Negative for dysuria, hematuria and decreased urine volume.  Musculoskeletal: Negative for back pain.  Skin: Negative for color change.  Neurological: Negative for dizziness, syncope and light-headedness.    Allergies  Review of patient's allergies indicates no known allergies.  Home Medications   Current Outpatient Rx  Name Route Sig Dispense Refill  . FENUGREEK PO Oral Take 2 tablets by mouth 3 (three) times daily.    . OXYCODONE HCL 5 MG PO TABS Oral Take 1 tablet (5 mg total) by mouth every 6 (six) hours as needed for pain. 20 tablet 0  . PRENATAL MULTIVITAMIN CH Oral Take 1 tablet by mouth daily.      BP 112/68  Pulse 95  Temp(Src) 97.8 F (36.6 C) (Oral)  Resp 18  SpO2 98%  Physical Exam  Nursing note and vitals reviewed. Constitutional: She appears well-developed and well-nourished.       Uncomfortable appearing  HENT:  Head:  Normocephalic and atraumatic.  Mouth/Throat: Oropharynx is clear and moist.  Neck: Normal range of motion. Neck supple.  Cardiovascular: Normal rate, regular rhythm and normal heart sounds.   Pulmonary/Chest: Effort normal and breath sounds normal.  Abdominal: Soft. Bowel sounds are normal. She exhibits no distension and no mass. There is tenderness in the right upper quadrant, epigastric area and left upper quadrant. There is positive Murphy's sign. There is no rigidity, no rebound, no guarding and no tenderness at McBurney's point.    Neurological: She is alert.  Skin: Skin is warm and dry. She is not diaphoretic.  Psychiatric: She has a normal mood and affect.    ED Course  Procedures (including critical care time)   Labs Reviewed  CBC  DIFFERENTIAL  COMPREHENSIVE METABOLIC PANEL  LIPASE, BLOOD  PREGNANCY, URINE  URINALYSIS, ROUTINE W REFLEX MICROSCOPIC   US Abdomen Complete  05/31/2011  *RADIOLOGY REPORT*  Clinical Data:  Abdominal pain.  Nausea.  COMPLETE ABDOMINAL ULTRASOUND  Comparison:  08/19/2003 CT.  Findings:  Gallbladder:  Small gallstones are present within the gallbladder. These appear adherent to the gallbladder wall.  Gallbladder wall measures 3 mm, normal.  There are no striations.  No pericholecystic fluid.  No sonographic Murphy's sign. Largest gallstone measures 11 mm.  Common bile duct:  4 mm, normal.  Liver:  No focal lesion identified.  Within normal limits in parenchymal echogenicity.  IVC:  Appears normal.  Pancreas:  No focal abnormality seen.  Spleen:  7.9 cm.  Normal echotexture.  Right Kidney:  10.7 cm. Normal echotexture.  Normal central sinus echo complex.  No calculi or hydronephrosis.  Left Kidney:  10.0 cm. Normal echotexture.  Normal central sinus echo complex.  No calculi or hydronephrosis.  Abdominal aorta:  No aneurysm identified.  IMPRESSION: Cholelithiasis without sonographic findings of cholecystitis.  Original Report Authenticated By: Andreas Newport, M.D.     No diagnosis found.  1:30 PM Reassessed patient.  She reports that her pain has improved somewhat, but continues to have significant pain.  She reports that vomiting has continued.  Will order Dilaudid and Phenergan.    2:09 PM Discussed with [facility] Dr. Ewing Schlein.  He recommends ordering an abdominal CT to get more information.  He reports that he will come see patient later today. 3:25 PM Discussed with [facility].  They report that they will admit patient.  MDM  Patient with Gallstone Pancreatitis diagnosed two  days ago in the ED.  She comes in today with epigastric pain, nausea, and vomiting.  Her LFT's are mildly elevated, lipase>3000, and WBC mildly elevated.  Therefore, General surgery and GI have both been consulted.  General surgery will admit and GI will consult.          Pascal Lux Cutten, PA-C 06/02/11 1725

## 2011-06-03 LAB — DIFFERENTIAL
Basophils Relative: 0 % (ref 0–1)
Lymphs Abs: 1 10*3/uL (ref 0.7–4.0)
Monocytes Absolute: 0.9 10*3/uL (ref 0.1–1.0)
Monocytes Relative: 8 % (ref 3–12)
Neutro Abs: 10.4 10*3/uL — ABNORMAL HIGH (ref 1.7–7.7)
Neutrophils Relative %: 84 % — ABNORMAL HIGH (ref 43–77)

## 2011-06-03 LAB — CBC
HCT: 39.1 % (ref 36.0–46.0)
Hemoglobin: 12.8 g/dL (ref 12.0–15.0)
MCH: 31.1 pg (ref 26.0–34.0)
MCHC: 32.7 g/dL (ref 30.0–36.0)
RBC: 4.12 MIL/uL (ref 3.87–5.11)

## 2011-06-03 LAB — COMPREHENSIVE METABOLIC PANEL
ALT: 52 U/L — ABNORMAL HIGH (ref 0–35)
Albumin: 3.8 g/dL (ref 3.5–5.2)
Alkaline Phosphatase: 91 U/L (ref 39–117)
Chloride: 105 mEq/L (ref 96–112)
Glucose, Bld: 58 mg/dL — ABNORMAL LOW (ref 70–99)
Potassium: 4.8 mEq/L (ref 3.5–5.1)
Sodium: 138 mEq/L (ref 135–145)
Total Bilirubin: 4.9 mg/dL — ABNORMAL HIGH (ref 0.3–1.2)
Total Protein: 6.5 g/dL (ref 6.0–8.3)

## 2011-06-03 LAB — LIPASE, BLOOD: Lipase: 2213 U/L — ABNORMAL HIGH (ref 11–59)

## 2011-06-03 LAB — AMYLASE: Amylase: 1688 U/L — ABNORMAL HIGH (ref 0–105)

## 2011-06-03 MED ORDER — CHLORHEXIDINE GLUCONATE 0.12 % MT SOLN
15.0000 mL | Freq: Two times a day (BID) | OROMUCOSAL | Status: DC
  Administered 2011-06-03 – 2011-06-06 (×6): 15 mL via OROMUCOSAL
  Filled 2011-06-03 (×5): qty 15

## 2011-06-03 MED ORDER — BIOTENE DRY MOUTH MT LIQD
15.0000 mL | Freq: Two times a day (BID) | OROMUCOSAL | Status: DC
  Administered 2011-06-03 – 2011-06-06 (×3): 15 mL via OROMUCOSAL

## 2011-06-03 MED ORDER — ENOXAPARIN SODIUM 40 MG/0.4ML ~~LOC~~ SOLN
40.0000 mg | SUBCUTANEOUS | Status: DC
  Administered 2011-06-03 – 2011-06-05 (×3): 40 mg via SUBCUTANEOUS
  Filled 2011-06-03 (×4): qty 0.4

## 2011-06-03 NOTE — Progress Notes (Signed)
Subjective: Pain better controlled on PCA  Objective: Vital signs in last 24 hours: Temp:  [97.8 F (36.6 C)-99.2 F (37.3 C)] 98.1 F (36.7 C) (05/10 0628) Pulse Rate:  [73-104] 97  (05/10 0628) Resp:  [13-20] 20  (05/10 0743) BP: (105-127)/(57-75) 123/62 mmHg (05/10 0628) SpO2:  [95 %-100 %] 100 % (05/10 0743) Weight:  [90.719 kg (200 lb)] 90.719 kg (200 lb) (05/09 2323)    Intake/Output from previous day: 05/09 0701 - 05/10 0700 In: 1644 [I.V.:1644] Out: 1200 [Urine:1200] Intake/Output this shift:    General appearance: alert and cooperative Resp: clear to auscultation bilaterally Cardio: regular rate and rhythm GI: soft, some epigastric tenderness, +bs  Lab Results:   Basename 06/03/11 0650 06/02/11 1222  WBC 12.4* 13.8*  HGB 12.8 14.6  HCT 39.1 44.2  PLT 203 213   BMET  Basename 06/02/11 1222 05/31/11 1630  NA 141 140  K 3.9 3.5  CL 102 101  CO2 24 28  GLUCOSE 98 90  BUN 13 13  CREATININE 0.85 0.80  CALCIUM 9.6 10.3   PT/INR No results found for this basename: LABPROT:2,INR:2 in the last 72 hours ABG No results found for this basename: PHART:2,PCO2:2,PO2:2,HCO3:2 in the last 72 hours  Studies/Results: Ct Abdomen Pelvis W Contrast  06/02/2011  *RADIOLOGY REPORT*  Clinical Data: Right upper quadrant abdominal pain.  Markedly elevated serum lipase and mildly elevated liver function tests. Leukocytosis.  History of ulcerative colitis.  6 weeks post-partum.  CT ABDOMEN AND PELVIS WITH CONTRAST 06/02/2011:  Technique:  Multidetector CT imaging of the abdomen and pelvis was performed following the standard protocol during bolus administration of intravenous contrast.  Contrast: OMNIPAQUE IOHEXOL 300 MG/ML. Oral contrast was also administered.  Comparison: CT abdomen and pelvis 08/19/2003 and 05/14/2002.  Findings: Edema involving the head, uncinate, and body of the pancreas with associated peripancreatic edema/inflammation.  The distal tail is uninvolved.   Normal pancreatic enhancement throughout.  No cysts within the pancreas.  No free fluid within the abdomen or pelvis.  Normal appearing liver, spleen, adrenal glands, and kidneys. Thickened gallbladder wall up to approximately 5-6 mm without calcified gallstones.  No biliary ductal dilation.  Filling defect within the intrapancreatic portion of the distal common bile duct. No visible aorto-iliofemoral atherosclerosis. Scattered normal- sized retroperitoneal lymph nodes in the abdomen; no significant lymphadenopathy.  Normal appearing stomach, small bowel, and colon.  Large stool burden.  Cecum positioned in the right mid abdomen, and normal appearing retrocecal appendix identified.  Uterus and ovaries unremarkable by CT.  Urinary bladder unremarkable.  Bone window images demonstrate a vacuum phenomenon in the sacroiliac joints but are otherwise unremarkable. Visualized lung bases clear apart from the expected dependent atelectasis posteriorly.  Heart size normal.  IMPRESSION:  1.  Acute pancreatitis involving the head, uncinate, and body of the pancreas.  No evidence of pancreatic necrosis.  No pseudocyst. 2.  Gallstone suspected within the distal common bile duct, likely the cause of the acute pancreatitis. 3.  Gallbladder wall thickening up to approximately 6 mm is consistent with chronic cholecystitis, in the absence of pericholecystic edema/inflammation at this time. 4.  Large stool burden.  Original Report Authenticated By: Arnell Sieving, M.D.    Anti-infectives: Anti-infectives    None      Assessment/Plan: Biliary pancreatitis Enzymes improving gradually - lipase 2213, amylase 1688. Continue bowel rest and F/U labs Lap chole once pancreatitis improves Lactating - pump and dump VTE - start lovenox, will have to hold pre-op  LOS: 1 day    [name] E 06/03/2011

## 2011-06-03 NOTE — Progress Notes (Signed)
INITIAL ADULT NUTRITION ASSESSMENT Date: 06/03/2011   Time: 3:13 PM Reason for Assessment: nutrition risk; lactating  ASSESSMENT: Female 26 y.o.  Dx: Gallstone pancreatitis  Hx:  Past Medical History  Diagnosis Date  . Ulcerative colitis   . Small for dates affecting management of mother 04/16/2011  . Migraines   . Gallstone pancreatitis 06/02/11   Past Surgical History  Procedure Date  . Cesarean section 2008  . Cesarean section 04/18/2011    Procedure: CESAREAN SECTION;  Surgeon: Sherron Monday, MD;  Location: [facility];  Service: Gynecology;  Laterality: N/A;    Related Meds:  Scheduled Meds:   . antiseptic oral rinse  15 mL Mouth Rinse q12n4p  . chlorhexidine  15 mL Mouth Rinse BID  . enoxaparin (LOVENOX) injection  40 mg Subcutaneous Q24H  .  HYDROmorphone (DILAUDID) injection  1 mg Intravenous Once  .  HYDROmorphone (DILAUDID) injection  1 mg Intravenous Once  . HYDROmorphone PCA 0.3 mg/mL   Intravenous Q4H  . ondansetron  4 mg Intravenous Once  . pantoprazole (PROTONIX) IV  40 mg Intravenous QHS   Continuous Infusions:   . 0.9 % NaCl with KCl 20 mEq / L 200 mL/hr at 06/03/11 1155   PRN Meds:.diphenhydrAMINE, diphenhydrAMINE, HYDROmorphone (DILAUDID) injection, iohexol, naloxone, ondansetron (ZOFRAN) IV, promethazine, sodium chloride, DISCONTD: diphenhydrAMINE, DISCONTD: diphenhydrAMINE, DISCONTD: ondansetron   Ht: 5\' 3"  (160 cm)  Wt: 200 lb (90.719 kg)  Ideal Wt: 61.4 kg % Ideal Wt: 147%  Usual Wt: difficult to assess, pt recently post-partum % Usual Wt:   Body mass index is 35.43 kg/(m^2).  Food/Nutrition Related Hx: 6 weeks post-partum, breastfeeding, recent episode of pancreatitis  Labs:  CMP     Component Value Date/Time   NA 138 06/03/2011 0650   K 4.8 06/03/2011 0650   CL 105 06/03/2011 0650   CO2 17* 06/03/2011 0650   GLUCOSE 58* 06/03/2011 0650   BUN 11 06/03/2011 0650   CREATININE 0.78 06/03/2011 0650   CALCIUM 8.7 06/03/2011 0650   PROT 6.5  06/03/2011 0650   ALBUMIN 3.8 06/03/2011 0650   AST 68* 06/03/2011 0650   ALT 52* 06/03/2011 0650   ALKPHOS 91 06/03/2011 0650   BILITOT 4.9* 06/03/2011 0650   GFRNONAA >90 06/03/2011 0650   GFRAA >90 06/03/2011 0650    CBC    Component Value Date/Time   WBC 12.4* 06/03/2011 0650   RBC 4.12 06/03/2011 0650   HGB 12.8 06/03/2011 0650   HCT 39.1 06/03/2011 0650   PLT 203 06/03/2011 0650   MCV 94.9 06/03/2011 0650   MCH 31.1 06/03/2011 0650   MCHC 32.7 06/03/2011 0650   RDW 12.1 06/03/2011 0650   LYMPHSABS 1.0 06/03/2011 0650   MONOABS 0.9 06/03/2011 0650   EOSABS 0.1 06/03/2011 0650   BASOSABS 0.0 06/03/2011 0650    Intake: NPO Output:   Intake/Output Summary (Last 24 hours) at 06/03/11 1516 Last data filed at 06/03/11 1400  Gross per 24 hour  Intake   3982 ml  Output   3000 ml  Net    982 ml   No stool output.  Diet Order: NPO  Supplements/Tube Feeding: none at this time1  IVF:    0.9 % NaCl with KCl 20 mEq / L Last Rate: 200 mL/hr at 06/03/11 1155    Estimated Nutritional Needs:   Kcal: 1850-1960 Protein: 73-85g Fluid: ~1.8 L/day  NUTRITION DIAGNOSIS: -Increased nutrient needs (NI-5.1).  Status: Resolved  RELATED TO: increased metabolic demand post-partum  AS EVIDENCE BY: pt  lactating, breastfeeding  MONITORING/EVALUATION(Goals): 1.  Food/Beverage; resume of PO diet  EDUCATION NEEDS: -No education needs identified at this time  INTERVENTION: 1.  Modify diet; in order to encourage breastmilk production, resume diet as soon as medically appropriate.  2.  Nutrition support; if pt unable to advance diet within 72 hrs, recommend initiation of distal duodenal feeds or parenteral nutrition. 3.  Fluids; encourage fluids as able or via IVF  Dietitian [phone]  DOCUMENTATION CODES Per approved criteria  -Obesity Unspecified    Loyce Dys Sue-Ellen 06/03/2011, 3:13 PM

## 2011-06-03 NOTE — Clinical Social Work Psychosocial (Signed)
     Clinical Social Work Department BRIEF PSYCHOSOCIAL ASSESSMENT 06/03/2011  Patient:  [name], [name]     Account Number:  [id number]     Admit date:  06/02/2011  Clinical Social Worker:  Dennison Bulla  Date/Time:  06/03/2011 02:00 PM  Referred by:  Physician  Date Referred:  06/03/2011 Referred for  Advanced Directives   Other Referral:   Interview type:  Patient Other interview type:   Mother and children present    PSYCHOSOCIAL DATA Living Status:  FAMILY Admitted from facility:   Level of care:   Primary support name:  Chrissie Noa Primary support relationship to patient:  SPOUSE Degree of support available:   Adequate    CURRENT CONCERNS Current Concerns  Other - See comment   Other Concerns:   Advanced directives    SOCIAL WORK ASSESSMENT / PLAN CSW received referral to assist with advanced directives. CSW met with patient at bedside. Patient had mother visiting along with her two small children. CSW introduced herself and explained role. Patient was receptive to consult and allowed visitors to remain present.    Patient reported she was asked about advanced directives in the ED. Patient reported she and husband were interested in AD. Patient receptive to discussing packet but reported she did not want to make decisions until reviewing with her husband. CSW explained packet and informed patient that CSW could notarize and explained outside facilities that notarize in case patient wanted to wait to complete packet after she discharged. CSW left packet and CSW contact information with patient. CSW is signing off but available if needed.   Assessment/plan status:  No Further Intervention Required Other assessment/ plan:   Information/referral to community resources:   Advanced directives packet    PATIENTS/FAMILYS RESPONSE TO PLAN OF CARE: Patient was alert and oriented and playing with children in bed. Patient receptive to talking with CSW. Patient will discuss  plans with husband.

## 2011-06-03 NOTE — Progress Notes (Addendum)
Atticus L Tortora 10:37 AM  Subjective: Patient is doing better than she was yesterday but is not passing air from below but is burping some and has no new complaints and we reviewed her CT Objective: Vital signs stable afebrile no acute distress abdomen is soft rare bowel sound no guarding or rebound minimal upper discomfort white count decreased BUN decreased transaminases stable increased bilirubin decrease pancreatic enzyme  Assessment: Gallstone pancreatitis with questionable residual stone versus sludge on CT  Plan: Since she's getting better and no signs of cholangitis we can continue to observe for now and hold urgent ERCP. We discussed the risks benefits methods of ERCP versus MRCP versus EUS and will continue to follow to decide which of the above needs to be done or if we should proceed with laparoscopic cholecystectomy with intraoperative cholangiogram first and continue to hold antibiotics for now but begin them ASAP signs of cholangitis  Mylan Lengyel E

## 2011-06-04 LAB — DIFFERENTIAL
Basophils Relative: 0 % (ref 0–1)
Eosinophils Absolute: 0.1 10*3/uL (ref 0.0–0.7)
Eosinophils Absolute: 0.2 10*3/uL (ref 0.0–0.7)
Eosinophils Relative: 1 % (ref 0–5)
Lymphs Abs: 1.1 10*3/uL (ref 0.7–4.0)
Monocytes Absolute: 0.6 10*3/uL (ref 0.1–1.0)
Monocytes Absolute: 0.8 10*3/uL (ref 0.1–1.0)
Monocytes Relative: 7 % (ref 3–12)
Monocytes Relative: 10 % (ref 3–12)
Neutrophils Relative %: 73 % (ref 43–77)

## 2011-06-04 LAB — COMPREHENSIVE METABOLIC PANEL
ALT: 64 U/L — ABNORMAL HIGH (ref 0–35)
AST: 66 U/L — ABNORMAL HIGH (ref 0–37)
Alkaline Phosphatase: 128 U/L — ABNORMAL HIGH (ref 39–117)
BUN: 7 mg/dL (ref 6–23)
CO2: 18 mEq/L — ABNORMAL LOW (ref 19–32)
Calcium: 9.2 mg/dL (ref 8.4–10.5)
Calcium: 9.1 mg/dL (ref 8.4–10.5)
Creatinine, Ser: 0.67 mg/dL (ref 0.50–1.10)
GFR calc Af Amer: >90 mL/min (ref >90)
GFR calc non Af Amer: >90 mL/min (ref >90)
Glucose, Bld: 63 mg/dL — ABNORMAL LOW (ref 70–99)
Potassium: 4.5 mEq/L (ref 3.5–5.1)
Sodium: 136 mEq/L (ref 135–145)
Total Protein: 6.4 g/dL (ref 6.0–8.3)

## 2011-06-04 LAB — CBC
HCT: 34.1 % — ABNORMAL LOW (ref 36.0–46.0)
Hemoglobin: 11.6 g/dL — ABNORMAL LOW (ref 12.0–15.0)
Hemoglobin: 11.3 g/dL — ABNORMAL LOW (ref 12.0–15.0)
MCH: 30.4 pg (ref 26.0–34.0)
MCH: 31.4 pg (ref 26.0–34.0)
MCHC: 32.3 g/dL (ref 30.0–36.0)
MCHC: 33.1 g/dL (ref 30.0–36.0)
MCV: 94.2 fL (ref 78.0–100.0)
RBC: 3.81 MIL/uL — ABNORMAL LOW (ref 3.87–5.11)

## 2011-06-04 MED ORDER — LIP MEDEX EX OINT
1.0000 "application " | TOPICAL_OINTMENT | Freq: Two times a day (BID) | CUTANEOUS | Status: DC
  Filled 2011-06-04: qty 7

## 2011-06-04 MED ORDER — PSYLLIUM 95 % PO PACK
1.0000 | PACK | Freq: Two times a day (BID) | ORAL | Status: DC
  Administered 2011-06-04 – 2011-06-06 (×2): 1 via ORAL
  Filled 2011-06-04 (×8): qty 1

## 2011-06-04 MED ORDER — BLISTEX EX OINT
TOPICAL_OINTMENT | Freq: Two times a day (BID) | CUTANEOUS | Status: DC
  Administered 2011-06-04 – 2011-06-05 (×2): via TOPICAL
  Filled 2011-06-04: qty 10

## 2011-06-04 MED ORDER — ALUM & MAG HYDROXIDE-SIMETH 200-200-20 MG/5ML PO SUSP: 30.0000 mL | Freq: Four times a day (QID) | ORAL | Status: DC | PRN

## 2011-06-04 MED ORDER — BISACODYL 10 MG RE SUPP: 10.0000 mg | Freq: Two times a day (BID) | RECTAL | Status: DC | PRN

## 2011-06-04 NOTE — Progress Notes (Signed)
[facility] Progress Note  Subjective: Pain better, no vomiting  Objective: Vital signs in last 24 hours: Temp:  [98.5 F (36.9 C)-99.9 F (37.7 C)] 98.5 F (36.9 C) (05/11 0600) Pulse Rate:  [96-120] 96  (05/11 0600) Resp:  [14-20] 15  (05/11 0820) BP: (109-125)/(50-72) 109/50 mmHg (05/11 0600) SpO2:  [93 %-100 %] 96 % (05/11 0820) Weight change:    PE: Abdomen moderately tender, positive bowel sounds  Lab Results: Results for orders placed during the hospital encounter of 06/02/11 (from the past 24 hour(s))  LIPASE, BLOOD     Status: Abnormal   Collection Time   06/04/11  7:10 AM      Component Value Range   Lipase 408 (*) 11 - 59 (U/L)  AMYLASE     Status: Abnormal   Collection Time   06/04/11  7:10 AM      Component Value Range   Amylase 566 (*) 0 - 105 (U/L)  CBC     Status: Abnormal   Collection Time   06/04/11  7:10 AM      Component Value Range   WBC 8.5  4.0 - 10.5 (K/uL)   RBC 3.81 (*) 3.87 - 5.11 (MIL/uL)   Hemoglobin 11.6 (*) 12.0 - 15.0 (g/dL)   HCT 16.1 (*) 09.6 - 46.0 (%)   MCV 94.2  78.0 - 100.0 (fL)   MCH 30.4  26.0 - 34.0 (pg)   MCHC 32.3  30.0 - 36.0 (g/dL)   RDW 04.5  40.9 - 81.1 (%)   Platelets 183  150 - 400 (K/uL)  DIFFERENTIAL     Status: Abnormal   Collection Time   06/04/11  7:10 AM      Component Value Range   Neutrophils Relative 79 (*) 43 - 77 (%)   Neutro Abs 6.7  1.7 - 7.7 (K/uL)   Lymphocytes Relative 13  12 - 46 (%)   Lymphs Abs 1.1  0.7 - 4.0 (K/uL)   Monocytes Relative 7  3 - 12 (%)   Monocytes Absolute 0.6  0.1 - 1.0 (K/uL)   Eosinophils Relative 1  0 - 5 (%)   Eosinophils Absolute 0.1  0.0 - 0.7 (K/uL)   Basophils Relative 0  0 - 1 (%)   Basophils Absolute 0.0  0.0 - 0.1 (K/uL)  COMPREHENSIVE METABOLIC PANEL     Status: Abnormal   Collection Time   06/04/11  7:10 AM      Component Value Range   Sodium 137  135 - 145 (mEq/L)   Potassium 5.0  3.5 - 5.1 (mEq/L)   Chloride 102  96 - 112 (mEq/L)   CO2 18 (*) 19 -  32 (mEq/L)   Glucose, Bld 63 (*) 70 - 99 (mg/dL)   BUN 7  6 - 23 (mg/dL)   Creatinine, Ser 9.14  0.50 - 1.10 (mg/dL)   Calcium 9.2  8.4 - 78.2 (mg/dL)   Total Protein 6.4  6.0 - 8.3 (g/dL)   Albumin 3.5  3.5 - 5.2 (g/dL)   AST 77 (*) 0 - 37 (U/L)   ALT 68 (*) 0 - 35 (U/L)   Alkaline Phosphatase 128 (*) 39 - 117 (U/L)   Total Bilirubin 7.4 (*) 0.3 - 1.2 (mg/dL)   GFR calc non Af Amer >90  >90 (mL/min)   GFR calc Af Amer >90  >90 (mL/min)    Studies/Results: Ct Abdomen Pelvis W Contrast  06/02/2011  *RADIOLOGY REPORT*  Clinical Data: Right upper quadrant abdominal pain.  Markedly elevated serum lipase and mildly elevated liver function tests. Leukocytosis.  History of ulcerative colitis.  6 weeks post-partum.  CT ABDOMEN AND PELVIS WITH CONTRAST 06/02/2011:  Technique:  Multidetector CT imaging of the abdomen and pelvis was performed following the standard protocol during bolus administration of intravenous contrast.  Contrast: OMNIPAQUE IOHEXOL 300 MG/ML. Oral contrast was also administered.  Comparison: CT abdomen and pelvis 08/19/2003 and 05/14/2002.  Findings: Edema involving the head, uncinate, and body of the pancreas with associated peripancreatic edema/inflammation.  The distal tail is uninvolved.  Normal pancreatic enhancement throughout.  No cysts within the pancreas.  No free fluid within the abdomen or pelvis.  Normal appearing liver, spleen, adrenal glands, and kidneys. Thickened gallbladder wall up to approximately 5-6 mm without calcified gallstones.  No biliary ductal dilation.  Filling defect within the intrapancreatic portion of the distal common bile duct. No visible aorto-iliofemoral atherosclerosis. Scattered normal- sized retroperitoneal lymph nodes in the abdomen; no significant lymphadenopathy.  Normal appearing stomach, small bowel, and colon.  Large stool burden.  Cecum positioned in the right mid abdomen, and normal appearing retrocecal appendix identified.  Uterus and  ovaries unremarkable by CT.  Urinary bladder unremarkable.  Bone window images demonstrate a vacuum phenomenon in the sacroiliac joints but are otherwise unremarkable. Visualized lung bases clear apart from the expected dependent atelectasis posteriorly.  Heart size normal.  IMPRESSION:  1.  Acute pancreatitis involving the head, uncinate, and body of the pancreas.  No evidence of pancreatic necrosis.  No pseudocyst. 2.  Gallstone suspected within the distal common bile duct, likely the cause of the acute pancreatitis. 3.  Gallbladder wall thickening up to approximately 6 mm is consistent with chronic cholecystitis, in the absence of pericholecystic edema/inflammation at this time. 4.  Large stool burden.  Original Report Authenticated By: Arnell Sieving, M.D.      Assessment: Gallstone pancreatitis with pancreatic enzymes and transaminases improving but with rising bilirubin raising a question of persistent retained common bile duct stone.  Plan: Follow clinically recheck pertinent labs and consider MRCP, ERCP or proceeding straight to laparoscopic cholecystectomy with intraoperative cholangiogram after sufficient clinical improvement. Would start antibiotics for fever or rising white blood cell count and then probably proceed with ERCP in that event    Timithy Arons C 06/04/2011, 9:29 AM

## 2011-06-04 NOTE — Progress Notes (Signed)
[name] [id number] 1985/11/17  CARE TEAM:  PCP: Kaleen Mask, MD, MD  Outpatient Care Team: Patient Care Team: Kaleen Mask, MD as PCP - General (Family Medicine)  Inpatient Treatment Team: Treatment Team: Attending Provider: Bishop Limbo, MD; Consulting Physician: Petra Kuba, MD; Registered Nurse: Bayard Beaver, RN; Registered Nurse: Rudene Anda, RN; Technician: Lenoria Farrier, NT; Registered Nurse: Costella Hatcher, RN; Consulting Physician: Barrie Folk, MD; Registered Nurse: Launa Flight, RN; Technician: Star Age, NT  Subjective:  Feels better Pain down but not gone No N/V Not walking much  Objective:  Vital signs:  Filed Vitals:   06/04/11 0000 06/04/11 0323 06/04/11 0600 06/04/11 0820  BP:   109/50   Pulse:   96   Temp:   98.5 F (36.9 C)   TempSrc:   Oral   Resp: 15 14 18 15   Height:      Weight:      SpO2: 95% 96% 100% 96%    Last BM Date: 06/02/11  Intake/Output   Yesterday:  05/10 0701 - 05/11 0700 In: 2578 [P.O.:600; I.V.:1978] Out: 5500 [Urine:5500] This shift:     Bowel function:  Flatus: y  BM: n  Physical Exam:  General: Pt awake/alert/oriented x4 in no acute distress Eyes: PERRL, normal EOM.  Sclera clear.  No icterus Neuro: CN II-XII intact w/o focal sensory/motor deficits. Lymph: No head/neck/groin lymphadenopathy Psych:  No delerium/psychosis/paranoia HENT: Normocephalic, Mucus membranes moist.  No thrush Neck: Supple, No tracheal deviation Chest: No chest wall pain w good excursion CV:  Pulses intact.  Regular rhythm Abdomen: Soft.  Nondistended.  Mildly tender at LUQ>RUQ.  No incarcerated hernias. Ext:  SCDs BLE.  No mjr edema.  No cyanosis Skin: No petechiae / purpurae  Results:   Labs: Results for orders placed during the hospital encounter of 06/02/11 (from the past 48 hour(s))  CBC     Status: Abnormal   Collection Time   06/02/11 12:22 PM      Component Value Range Comment    WBC 13.8 (*) 4.0 - 10.5 (K/uL)    RBC 4.78  3.87 - 5.11 (MIL/uL)    Hemoglobin 14.6  12.0 - 15.0 (g/dL)    HCT 40.9  81.1 - 91.4 (%)    MCV 92.5  78.0 - 100.0 (fL)    MCH 30.5  26.0 - 34.0 (pg)    MCHC 33.0  30.0 - 36.0 (g/dL)    RDW 78.2  95.6 - 21.3 (%)    Platelets 213  150 - 400 (K/uL)   DIFFERENTIAL     Status: Abnormal   Collection Time   06/02/11 12:22 PM      Component Value Range Comment   Neutrophils Relative 80 (*) 43 - 77 (%)    Neutro Abs 11.1 (*) 1.7 - 7.7 (K/uL)    Lymphocytes Relative 12  12 - 46 (%)    Lymphs Abs 1.6  0.7 - 4.0 (K/uL)    Monocytes Relative 7  3 - 12 (%)    Monocytes Absolute 1.0  0.1 - 1.0 (K/uL)    Eosinophils Relative 1  0 - 5 (%)    Eosinophils Absolute 0.2  0.0 - 0.7 (K/uL)    Basophils Relative 0  0 - 1 (%)    Basophils Absolute 0.0  0.0 - 0.1 (K/uL)   COMPREHENSIVE METABOLIC PANEL     Status: Abnormal   Collection Time   06/02/11 12:22 PM  Component Value Range Comment   Sodium 141  135 - 145 (mEq/L)    Potassium 3.9  3.5 - 5.1 (mEq/L)    Chloride 102  96 - 112 (mEq/L)    CO2 24  19 - 32 (mEq/L)    Glucose, Bld 98  70 - 99 (mg/dL)    BUN 13  6 - 23 (mg/dL)    Creatinine, Ser 1.61  0.50 - 1.10 (mg/dL)    Calcium 9.6  8.4 - 10.5 (mg/dL)    Total Protein 7.6  6.0 - 8.3 (g/dL)    Albumin 4.5  3.5 - 5.2 (g/dL)    AST 49 (*) 0 - 37 (U/L)    ALT 45 (*) 0 - 35 (U/L)    Alkaline Phosphatase 92  39 - 117 (U/L)    Total Bilirubin 1.2  0.3 - 1.2 (mg/dL)    GFR calc non Af Amer >90  >90 (mL/min)    GFR calc Af Amer >90  >90 (mL/min)   LIPASE, BLOOD     Status: Abnormal   Collection Time   06/02/11 12:22 PM      Component Value Range Comment   Lipase >3000 (*) 11 - 59 (U/L) RESULT CONFIRMED BY AUTOMATED DILUTION  PREGNANCY, URINE     Status: Normal   Collection Time   06/02/11  2:58 PM      Component Value Range Comment   Preg Test, Ur NEGATIVE  NEGATIVE    URINALYSIS, ROUTINE W REFLEX MICROSCOPIC     Status: Abnormal   Collection Time    06/02/11  2:58 PM      Component Value Range Comment   Color, Urine YELLOW  YELLOW     APPearance CLEAR  CLEAR     Specific Gravity, Urine 1.015  1.005 - 1.030     pH 5.5  5.0 - 8.0     Glucose, UA NEGATIVE  NEGATIVE (mg/dL)    Hgb urine dipstick NEGATIVE  NEGATIVE     Bilirubin Urine NEGATIVE  NEGATIVE     Ketones, ur 40 (*) NEGATIVE (mg/dL)    Protein, ur NEGATIVE  NEGATIVE (mg/dL)    Urobilinogen, UA 0.2  0.0 - 1.0 (mg/dL)    Nitrite NEGATIVE  NEGATIVE     Leukocytes, UA TRACE (*) NEGATIVE    URINE MICROSCOPIC-ADD ON     Status: Normal   Collection Time   06/02/11  2:58 PM      Component Value Range Comment   Squamous Epithelial / LPF RARE  RARE     WBC, UA 0-2  <3 (WBC/hpf)    Bacteria, UA RARE  RARE    LIPASE, BLOOD     Status: Abnormal   Collection Time   06/03/11  6:50 AM      Component Value Range Comment   Lipase 2213 (*) 11 - 59 (U/L)   AMYLASE     Status: Abnormal   Collection Time   06/03/11  6:50 AM      Component Value Range Comment   Amylase 1688 (*) 0 - 105 (U/L)   CBC     Status: Abnormal   Collection Time   06/03/11  6:50 AM      Component Value Range Comment   WBC 12.4 (*) 4.0 - 10.5 (K/uL)    RBC 4.12  3.87 - 5.11 (MIL/uL)    Hemoglobin 12.8  12.0 - 15.0 (g/dL)    HCT 09.6  04.5 - 40.9 (%)    MCV 94.9  78.0 - 100.0 (fL)    MCH 31.1  26.0 - 34.0 (pg)    MCHC 32.7  30.0 - 36.0 (g/dL)    RDW 13.0  86.5 - 78.4 (%)    Platelets 203  150 - 400 (K/uL)   DIFFERENTIAL     Status: Abnormal   Collection Time   06/03/11  6:50 AM      Component Value Range Comment   Neutrophils Relative 84 (*) 43 - 77 (%)    Neutro Abs 10.4 (*) 1.7 - 7.7 (K/uL)    Lymphocytes Relative 8 (*) 12 - 46 (%)    Lymphs Abs 1.0  0.7 - 4.0 (K/uL)    Monocytes Relative 8  3 - 12 (%)    Monocytes Absolute 0.9  0.1 - 1.0 (K/uL)    Eosinophils Relative 0  0 - 5 (%)    Eosinophils Absolute 0.1  0.0 - 0.7 (K/uL)    Basophils Relative 0  0 - 1 (%)    Basophils Absolute 0.0  0.0 - 0.1 (K/uL)     COMPREHENSIVE METABOLIC PANEL     Status: Abnormal   Collection Time   06/03/11  6:50 AM      Component Value Range Comment   Sodium 138  135 - 145 (mEq/L)    Potassium 4.8  3.5 - 5.1 (mEq/L)    Chloride 105  96 - 112 (mEq/L)    CO2 17 (*) 19 - 32 (mEq/L)    Glucose, Bld 58 (*) 70 - 99 (mg/dL)    BUN 11  6 - 23 (mg/dL)    Creatinine, Ser 6.96  0.50 - 1.10 (mg/dL)    Calcium 8.7  8.4 - 10.5 (mg/dL)    Total Protein 6.5  6.0 - 8.3 (g/dL)    Albumin 3.8  3.5 - 5.2 (g/dL)    AST 68 (*) 0 - 37 (U/L)    ALT 52 (*) 0 - 35 (U/L)    Alkaline Phosphatase 91  39 - 117 (U/L)    Total Bilirubin 4.9 (*) 0.3 - 1.2 (mg/dL)    GFR calc non Af Amer >90  >90 (mL/min)    GFR calc Af Amer >90  >90 (mL/min)   LIPASE, BLOOD     Status: Abnormal   Collection Time   06/04/11  7:10 AM      Component Value Range Comment   Lipase 408 (*) 11 - 59 (U/L)   AMYLASE     Status: Abnormal   Collection Time   06/04/11  7:10 AM      Component Value Range Comment   Amylase 566 (*) 0 - 105 (U/L)   CBC     Status: Abnormal   Collection Time   06/04/11  7:10 AM      Component Value Range Comment   WBC 8.5  4.0 - 10.5 (K/uL)    RBC 3.81 (*) 3.87 - 5.11 (MIL/uL)    Hemoglobin 11.6 (*) 12.0 - 15.0 (g/dL)    HCT 29.5 (*) 28.4 - 46.0 (%)    MCV 94.2  78.0 - 100.0 (fL)    MCH 30.4  26.0 - 34.0 (pg)    MCHC 32.3  30.0 - 36.0 (g/dL)    RDW 13.2  44.0 - 10.2 (%)    Platelets 183  150 - 400 (K/uL)   DIFFERENTIAL     Status: Abnormal   Collection Time   06/04/11  7:10 AM      Component Value Range Comment  Neutrophils Relative 79 (*) 43 - 77 (%)    Neutro Abs 6.7  1.7 - 7.7 (K/uL)    Lymphocytes Relative 13  12 - 46 (%)    Lymphs Abs 1.1  0.7 - 4.0 (K/uL)    Monocytes Relative 7  3 - 12 (%)    Monocytes Absolute 0.6  0.1 - 1.0 (K/uL)    Eosinophils Relative 1  0 - 5 (%)    Eosinophils Absolute 0.1  0.0 - 0.7 (K/uL)    Basophils Relative 0  0 - 1 (%)    Basophils Absolute 0.0  0.0 - 0.1 (K/uL)   COMPREHENSIVE  METABOLIC PANEL     Status: Abnormal   Collection Time   06/04/11  7:10 AM      Component Value Range Comment   Sodium 137  135 - 145 (mEq/L)    Potassium 5.0  3.5 - 5.1 (mEq/L)    Chloride 102  96 - 112 (mEq/L)    CO2 18 (*) 19 - 32 (mEq/L)    Glucose, Bld 63 (*) 70 - 99 (mg/dL)    BUN 7  6 - 23 (mg/dL)    Creatinine, Ser 1.61  0.50 - 1.10 (mg/dL)    Calcium 9.2  8.4 - 10.5 (mg/dL)    Total Protein 6.4  6.0 - 8.3 (g/dL)    Albumin 3.5  3.5 - 5.2 (g/dL)    AST 77 (*) 0 - 37 (U/L)    ALT 68 (*) 0 - 35 (U/L)    Alkaline Phosphatase 128 (*) 39 - 117 (U/L)    Total Bilirubin 7.4 (*) 0.3 - 1.2 (mg/dL)    GFR calc non Af Amer >90  >90 (mL/min)    GFR calc Af Amer >90  >90 (mL/min)     Imaging / Studies: Ct Abdomen Pelvis W Contrast  06/02/2011  *RADIOLOGY REPORT*  Clinical Data: Right upper quadrant abdominal pain.  Markedly elevated serum lipase and mildly elevated liver function tests. Leukocytosis.  History of ulcerative colitis.  6 weeks post-partum.  CT ABDOMEN AND PELVIS WITH CONTRAST 06/02/2011:  Technique:  Multidetector CT imaging of the abdomen and pelvis was performed following the standard protocol during bolus administration of intravenous contrast.  Contrast: OMNIPAQUE IOHEXOL 300 MG/ML. Oral contrast was also administered.  Comparison: CT abdomen and pelvis 08/19/2003 and 05/14/2002.  Findings: Edema involving the head, uncinate, and body of the pancreas with associated peripancreatic edema/inflammation.  The distal tail is uninvolved.  Normal pancreatic enhancement throughout.  No cysts within the pancreas.  No free fluid within the abdomen or pelvis.  Normal appearing liver, spleen, adrenal glands, and kidneys. Thickened gallbladder wall up to approximately 5-6 mm without calcified gallstones.  No biliary ductal dilation.  Filling defect within the intrapancreatic portion of the distal common bile duct. No visible aorto-iliofemoral atherosclerosis. Scattered normal- sized  retroperitoneal lymph nodes in the abdomen; no significant lymphadenopathy.  Normal appearing stomach, small bowel, and colon.  Large stool burden.  Cecum positioned in the right mid abdomen, and normal appearing retrocecal appendix identified.  Uterus and ovaries unremarkable by CT.  Urinary bladder unremarkable.  Bone window images demonstrate a vacuum phenomenon in the sacroiliac joints but are otherwise unremarkable. Visualized lung bases clear apart from the expected dependent atelectasis posteriorly.  Heart size normal.  IMPRESSION:  1.  Acute pancreatitis involving the head, uncinate, and body of the pancreas.  No evidence of pancreatic necrosis.  No pseudocyst. 2.  Gallstone suspected within the distal  common bile duct, likely the cause of the acute pancreatitis. 3.  Gallbladder wall thickening up to approximately 6 mm is consistent with chronic cholecystitis, in the absence of pericholecystic edema/inflammation at this time. 4.  Large stool burden.  Original Report Authenticated By: Arnell Sieving, M.D.    Medications / Allergies: per chart  Antibiotics: Anti-infectives    None      Problem List:  Principal Problem:  *Gallstone pancreatitis   Assessment  [name]  25 y.o. female       Feeling better Labs mixed  Plan:  -Seen w Dr Blanchard Mane [facility].  Prob needs MRCP vs ERCP given rise in LFTs -lap chole at some point when pancreatitis more resolved - prob in 2 days = Monday -controlling lactation with pumping -VTE prophylaxis- SCDs, etc -mobilize as tolerated to help recovery  Ardeth Sportsman, M.D., F.A.C.S. Gastrointestinal and Minimally Invasive Surgery [facility] [address] [place], [place] [address] [phone] Main / Paging [phone] Voice Mail   06/04/2011

## 2011-06-05 MED ORDER — CHLORHEXIDINE GLUCONATE 4 % EX LIQD
1.0000 "application " | Freq: Once | CUTANEOUS | Status: AC
  Administered 2011-06-06: 1 via TOPICAL
  Filled 2011-06-05 (×2): qty 15

## 2011-06-05 MED ORDER — CEFAZOLIN SODIUM-DEXTROSE 2-3 GM-% IV SOLR
2.0000 g | INTRAVENOUS | Status: AC
  Administered 2011-06-06: 2 g via INTRAVENOUS
  Filled 2011-06-05: qty 50

## 2011-06-05 MED ORDER — CHLORHEXIDINE GLUCONATE 4 % EX LIQD
1.0000 "application " | Freq: Once | CUTANEOUS | Status: AC
  Administered 2011-06-05: 1 via TOPICAL
  Filled 2011-06-05: qty 15

## 2011-06-05 NOTE — Progress Notes (Signed)
[name] [id number] [birth year]  CARE TEAM:  PCP: Kaleen Mask, MD, MD  Outpatient Care Team: Patient Care Team: Kaleen Mask, MD as PCP - General (Family Medicine)  Inpatient Treatment Team: Treatment Team: Attending Provider: Bishop Limbo, MD; Consulting Physician: Petra Kuba, MD; Registered Nurse: Bayard Beaver, RN; Registered Nurse: Rudene Anda, RN; Technician: Lenoria Farrier, NT; Consulting Physician: Barrie Folk, MD; Registered Nurse: Launa Flight, RN; Technician: Star Age, NT; Registered Nurse: Corena Herter, RN  Subjective:  Feels better Pain more down  No N/V Walking more  Objective:  Vital signs:  Filed Vitals:   06/04/11 1430 06/04/11 1600 06/04/11 2117 06/05/11 0634  BP: 123/70  135/69 110/68  Pulse: 107  100 84  Temp: 98.2 F (36.8 C)  98.8 F (37.1 C) 98.3 F (36.8 C)  TempSrc: Oral  Oral Oral  Resp: 17 12 16 18   Height:      Weight:      SpO2: 96% 96% 91% 96%    Last BM Date: 05/30/11  Intake/Output   Yesterday:  05/11 0701 - 05/12 0700 In: 1500 [I.V.:1500] Out: 3400 [Urine:3400] This shift:     Bowel function:  Flatus: y  BM: n  Physical Exam:  General: Pt awake/alert/oriented x4 in no acute distress Eyes: PERRL, normal EOM.  Sclera clear.  No icterus Neuro: CN II-XII intact w/o focal sensory/motor deficits. Lymph: No head/neck/groin lymphadenopathy Psych:  No delerium/psychosis/paranoia HENT: Normocephalic, Mucus membranes moist.  No thrush Neck: Supple, No tracheal deviation Chest: No chest wall pain w good excursion CV:  Pulses intact.  Regular rhythm Abdomen: Soft.  Nondistended.  Mildly tender at LUQ.  No incarcerated hernias. Ext:  SCDs BLE.  No mjr edema.  No cyanosis Skin: No petechiae / purpurae  Results:   Labs: Results for orders placed during the hospital encounter of 06/02/11 (from the past 48 hour(s))  LIPASE, BLOOD     Status: Abnormal   Collection Time   06/04/11  7:10 AM      Component Value Range Comment   Lipase 408 (*) 11 - 59 (U/L)   AMYLASE     Status: Abnormal   Collection Time   06/04/11  7:10 AM      Component Value Range Comment   Amylase 566 (*) 0 - 105 (U/L)   CBC     Status: Abnormal   Collection Time   06/04/11  7:10 AM      Component Value Range Comment   WBC 8.5  4.0 - 10.5 (K/uL)    RBC 3.81 (*) 3.87 - 5.11 (MIL/uL)    Hemoglobin 11.6 (*) 12.0 - 15.0 (g/dL)    HCT 47.4 (*) 25.9 - 46.0 (%)    MCV 94.2  78.0 - 100.0 (fL)    MCH 30.4  26.0 - 34.0 (pg)    MCHC 32.3  30.0 - 36.0 (g/dL)    RDW 56.3  87.5 - 64.3 (%)    Platelets 183  150 - 400 (K/uL)   DIFFERENTIAL     Status: Abnormal   Collection Time   06/04/11  7:10 AM      Component Value Range Comment   Neutrophils Relative 79 (*) 43 - 77 (%)    Neutro Abs 6.7  1.7 - 7.7 (K/uL)    Lymphocytes Relative 13  12 - 46 (%)    Lymphs Abs 1.1  0.7 - 4.0 (K/uL)    Monocytes Relative 7  3 -  12 (%)    Monocytes Absolute 0.6  0.1 - 1.0 (K/uL)    Eosinophils Relative 1  0 - 5 (%)    Eosinophils Absolute 0.1  0.0 - 0.7 (K/uL)    Basophils Relative 0  0 - 1 (%)    Basophils Absolute 0.0  0.0 - 0.1 (K/uL)   COMPREHENSIVE METABOLIC PANEL     Status: Abnormal   Collection Time   06/04/11  7:10 AM      Component Value Range Comment   Sodium 137  135 - 145 (mEq/L)    Potassium 5.0  3.5 - 5.1 (mEq/L)    Chloride 102  96 - 112 (mEq/L)    CO2 18 (*) 19 - 32 (mEq/L)    Glucose, Bld 63 (*) 70 - 99 (mg/dL)    BUN 7  6 - 23 (mg/dL)    Creatinine, Ser 0.10  0.50 - 1.10 (mg/dL)    Calcium 9.2  8.4 - 10.5 (mg/dL)    Total Protein 6.4  6.0 - 8.3 (g/dL)    Albumin 3.5  3.5 - 5.2 (g/dL)    AST 77 (*) 0 - 37 (U/L)    ALT 68 (*) 0 - 35 (U/L)    Alkaline Phosphatase 128 (*) 39 - 117 (U/L)    Total Bilirubin 7.4 (*) 0.3 - 1.2 (mg/dL)    GFR calc non Af Amer >90  >90 (mL/min)    GFR calc Af Amer >90  >90 (mL/min)   COMPREHENSIVE METABOLIC PANEL     Status: Abnormal   Collection Time    06/04/11 10:44 PM      Component Value Range Comment   Sodium 136  135 - 145 (mEq/L)    Potassium 4.5  3.5 - 5.1 (mEq/L)    Chloride 105  96 - 112 (mEq/L)    CO2 18 (*) 19 - 32 (mEq/L)    Glucose, Bld 54 (*) 70 - 99 (mg/dL)    BUN 6  6 - 23 (mg/dL)    Creatinine, Ser 2.72  0.50 - 1.10 (mg/dL)    Calcium 9.1  8.4 - 10.5 (mg/dL)    Total Protein 6.4  6.0 - 8.3 (g/dL)    Albumin 3.6  3.5 - 5.2 (g/dL)    AST 66 (*) 0 - 37 (U/L)    ALT 64 (*) 0 - 35 (U/L)    Alkaline Phosphatase 152 (*) 39 - 117 (U/L)    Total Bilirubin 4.0 (*) 0.3 - 1.2 (mg/dL)    GFR calc non Af Amer >90  >90 (mL/min)    GFR calc Af Amer >90  >90 (mL/min)   CBC     Status: Abnormal   Collection Time   06/04/11 10:44 PM      Component Value Range Comment   WBC 7.6  4.0 - 10.5 (K/uL)    RBC 3.60 (*) 3.87 - 5.11 (MIL/uL)    Hemoglobin 11.3 (*) 12.0 - 15.0 (g/dL)    HCT 53.6 (*) 64.4 - 46.0 (%)    MCV 94.7  78.0 - 100.0 (fL)    MCH 31.4  26.0 - 34.0 (pg)    MCHC 33.1  30.0 - 36.0 (g/dL)    RDW 03.4  74.2 - 59.5 (%)    Platelets 161  150 - 400 (K/uL)   DIFFERENTIAL     Status: Normal   Collection Time   06/04/11 10:44 PM      Component Value Range Comment   Neutrophils Relative 73  43 - 77 (%)    Neutro Abs 5.5  1.7 - 7.7 (K/uL)    Lymphocytes Relative 14  12 - 46 (%)    Lymphs Abs 1.1  0.7 - 4.0 (K/uL)    Monocytes Relative 10  3 - 12 (%)    Monocytes Absolute 0.8  0.1 - 1.0 (K/uL)    Eosinophils Relative 3  0 - 5 (%)    Eosinophils Absolute 0.2  0.0 - 0.7 (K/uL)    Basophils Relative 0  0 - 1 (%)    Basophils Absolute 0.0  0.0 - 0.1 (K/uL)   LIPASE, BLOOD     Status: Normal   Collection Time   06/04/11 10:44 PM      Component Value Range Comment   Lipase 59  11 - 59 (U/L)     Imaging / Studies: No results found.  Medications / Allergies: per chart  Antibiotics: Anti-infectives    None      Problem List:  Principal Problem:  *Gallstone pancreatitis   Assessment  [name]  25 y.o. female         Feeling better Labs better  Plan:  -lap chole at some point when pancreatitis more resolved - prob tomorrow = Monday.  Posted tenitively.  The issue is ERCP 1st if Bili does not resolve by tomorrow vs comcined case.  Will let the Monday teams sort it out depending how the pt progresses.  The anatomy & physiology of hepatobiliary & pancreatic function was discussed.  The pathophysiology of gallbladder dysfunction was discussed.  Natural history risks without surgery was discussed.   I feel the risks of no intervention will lead to serious problems that outweigh the operative risks; therefore, I recommended cholecystectomy to remove the pathology.  I explained laparoscopic techniques with possible need for an open approach.  Probable cholangiogram to evaluate the bilary tract was explained as well.    Risks such as bleeding, infection, abscess, leak, injury to other organs, need for further treatment, heart attack, death, and other risks were discussed.  I noted a good likelihood this will help address the problem.  Possibility that this will not correct all abdominal symptoms was explained.  Goals of post-operative recovery were discussed as well.  We will work to minimize complications.  An educational handout further explaining the pathology and treatment options was given as well.  Questions were answered.  The patient expresses understanding & wishes to proceed with surgery.  -controlling lactation with pumping -VTE prophylaxis- SCDs, etc -mobilize as tolerated to help recovery  Ardeth Sportsman, M.D., F.A.C.S. Gastrointestinal and Minimally Invasive Surgery [facility] [address] [place], [place] [address] [phone] Main / Paging [phone] Voice Mail   06/05/2011

## 2011-06-05 NOTE — Progress Notes (Signed)
[facility] Progress Note  Subjective: Pain somewhat better, rated as a for a scale of 1-10, down from 5 or 6 yesterday  Objective: Vital signs in last 24 hours: Temp:  [98.2 F (36.8 C)-98.8 F (37.1 C)] 98.3 F (36.8 C) (05/12 0634) Pulse Rate:  [84-107] 84  (05/12 0634) Resp:  [12-18] 18  (05/12 0634) BP: (110-135)/(68-70) 110/68 mmHg (05/12 0634) SpO2:  [91 %-99 %] 96 % (05/12 0634) Weight change:    PE: Mild to moderate upper abdominal tenderness  Lab Results: Results for orders placed during the hospital encounter of 06/02/11 (from the past 24 hour(s))  COMPREHENSIVE METABOLIC PANEL     Status: Abnormal   Collection Time   06/04/11 10:44 PM      Component Value Range   Sodium 136  135 - 145 (mEq/L)   Potassium 4.5  3.5 - 5.1 (mEq/L)   Chloride 105  96 - 112 (mEq/L)   CO2 18 (*) 19 - 32 (mEq/L)   Glucose, Bld 54 (*) 70 - 99 (mg/dL)   BUN 6  6 - 23 (mg/dL)   Creatinine, Ser 4.09  0.50 - 1.10 (mg/dL)   Calcium 9.1  8.4 - 81.1 (mg/dL)   Total Protein 6.4  6.0 - 8.3 (g/dL)   Albumin 3.6  3.5 - 5.2 (g/dL)   AST 66 (*) 0 - 37 (U/L)   ALT 64 (*) 0 - 35 (U/L)   Alkaline Phosphatase 152 (*) 39 - 117 (U/L)   Total Bilirubin 4.0 (*) 0.3 - 1.2 (mg/dL)   GFR calc non Af Amer >90  >90 (mL/min)   GFR calc Af Amer >90  >90 (mL/min)  CBC     Status: Abnormal   Collection Time   06/04/11 10:44 PM      Component Value Range   WBC 7.6  4.0 - 10.5 (K/uL)   RBC 3.60 (*) 3.87 - 5.11 (MIL/uL)   Hemoglobin 11.3 (*) 12.0 - 15.0 (g/dL)   HCT 91.4 (*) 78.2 - 46.0 (%)   MCV 94.7  78.0 - 100.0 (fL)   MCH 31.4  26.0 - 34.0 (pg)   MCHC 33.1  30.0 - 36.0 (g/dL)   RDW 95.6  21.3 - 08.6 (%)   Platelets 161  150 - 400 (K/uL)  DIFFERENTIAL     Status: Normal   Collection Time   06/04/11 10:44 PM      Component Value Range   Neutrophils Relative 73  43 - 77 (%)   Neutro Abs 5.5  1.7 - 7.7 (K/uL)   Lymphocytes Relative 14  12 - 46 (%)   Lymphs Abs 1.1  0.7 - 4.0 (K/uL)   Monocytes Relative 10  3 - 12 (%)   Monocytes Absolute 0.8  0.1 - 1.0 (K/uL)   Eosinophils Relative 3  0 - 5 (%)   Eosinophils Absolute 0.2  0.0 - 0.7 (K/uL)   Basophils Relative 0  0 - 1 (%)   Basophils Absolute 0.0  0.0 - 0.1 (K/uL)  LIPASE, BLOOD     Status: Normal   Collection Time   06/04/11 10:44 PM      Component Value Range   Lipase 59  11 - 59 (U/L)    Studies/Results: No results found.    Assessment: 1. Gallstone pancreatitis, bilirubin down from 7-4 2. Ulcerative colitis, colitis and  Plan: Overall I am leaning toward preoperative ERCP within the next day or 2, however if further significant drop in liver function tests, further clinical  improvement and recommended bypass surgery, proceeding to laparoscopic cholecystectomy with intraoperative cholangiogram would be a reasonable alternative.   Adilenne Ashworth C 06/05/2011, 7:43 AM

## 2011-06-06 ENCOUNTER — Inpatient Hospital Stay (HOSPITAL_COMMUNITY): Payer: Medicaid Other

## 2011-06-06 ENCOUNTER — Inpatient Hospital Stay (HOSPITAL_COMMUNITY): Payer: Medicaid Other | Admitting: Anesthesiology

## 2011-06-06 ENCOUNTER — Encounter (HOSPITAL_COMMUNITY): Admission: EM | Disposition: A | Discharge status: Home / Self Care | Payer: Self-pay | Source: Home / Self Care

## 2011-06-06 ENCOUNTER — Encounter (HOSPITAL_COMMUNITY): Payer: Self-pay | Admitting: Anesthesiology

## 2011-06-06 DIAGNOSIS — K8010 Calculus of gallbladder with chronic cholecystitis without obstruction: Secondary | ICD-10-CM

## 2011-06-06 HISTORY — PX: CHOLECYSTECTOMY: SHX55

## 2011-06-06 LAB — COMPREHENSIVE METABOLIC PANEL
Alkaline Phosphatase: 164 U/L — ABNORMAL HIGH (ref 39–117)
BUN: <3 mg/dL — ABNORMAL LOW (ref 6–23)
CO2: 23 mEq/L (ref 19–32)
GFR calc Af Amer: >90 mL/min (ref >90)
GFR calc non Af Amer: >90 mL/min (ref >90)
Glucose, Bld: 77 mg/dL (ref 70–99)
Sodium: 138 mEq/L (ref 135–145)
Total Bilirubin: 1.9 mg/dL — ABNORMAL HIGH (ref 0.3–1.2)
Total Protein: 6.2 g/dL (ref 6.0–8.3)

## 2011-06-06 LAB — CBC
MCH: 31.2 pg (ref 26.0–34.0)
MCV: 93.3 fL (ref 78.0–100.0)
Platelets: 186 10*3/uL (ref 150–400)
RDW: 11.9 % (ref 11.5–15.5)

## 2011-06-06 LAB — DIFFERENTIAL
Basophils Absolute: 0 10*3/uL (ref 0.0–0.1)
Eosinophils Absolute: 0.3 10*3/uL (ref 0.0–0.7)
Eosinophils Relative: 6 % — ABNORMAL HIGH (ref 0–5)

## 2011-06-06 LAB — LIPASE, BLOOD: Lipase: 54 U/L (ref 11–59)

## 2011-06-06 LAB — MRSA PCR SCREENING: MRSA by PCR: NEGATIVE

## 2011-06-06 SURGERY — LAPAROSCOPIC CHOLECYSTECTOMY WITH INTRAOPERATIVE CHOLANGIOGRAM
Anesthesia: General | Site: Abdomen | Wound class: Contaminated

## 2011-06-06 MED ORDER — ROCURONIUM BROMIDE 100 MG/10ML IV SOLN
INTRAVENOUS | Status: DC | PRN
  Administered 2011-06-06: 40 mg via INTRAVENOUS
  Administered 2011-06-06: 10 mg via INTRAVENOUS

## 2011-06-06 MED ORDER — BUPIVACAINE-EPINEPHRINE 0.25% -1:200000 IJ SOLN
INTRAMUSCULAR | Status: DC | PRN
  Administered 2011-06-06: 15 mL

## 2011-06-06 MED ORDER — HYDROMORPHONE HCL PF 1 MG/ML IJ SOLN
INTRAMUSCULAR | Status: AC
  Filled 2011-06-06: qty 1

## 2011-06-06 MED ORDER — PROPOFOL 10 MG/ML IV EMUL
INTRAVENOUS | Status: DC | PRN
  Administered 2011-06-06: 150 mg via INTRAVENOUS

## 2011-06-06 MED ORDER — POTASSIUM CHLORIDE IN NACL 20-0.9 MEQ/L-% IV SOLN
INTRAVENOUS | Status: DC
  Administered 2011-06-06: 22:00:00 via INTRAVENOUS
  Filled 2011-06-06 (×4): qty 1000

## 2011-06-06 MED ORDER — FENTANYL CITRATE 0.05 MG/ML IJ SOLN: 50.0000 ug | INTRAMUSCULAR | Status: DC | PRN

## 2011-06-06 MED ORDER — FENTANYL CITRATE 0.05 MG/ML IJ SOLN
INTRAMUSCULAR | Status: DC | PRN
  Administered 2011-06-06: 50 ug via INTRAVENOUS
  Administered 2011-06-06: 100 ug via INTRAVENOUS
  Administered 2011-06-06: 50 ug via INTRAVENOUS
  Administered 2011-06-06: 150 ug via INTRAVENOUS
  Administered 2011-06-06: 100 ug via INTRAVENOUS

## 2011-06-06 MED ORDER — 0.9 % SODIUM CHLORIDE (POUR BTL) OPTIME
TOPICAL | Status: DC | PRN
  Administered 2011-06-06: 1000 mL

## 2011-06-06 MED ORDER — HYDROMORPHONE HCL PF 1 MG/ML IJ SOLN: 0.5000 mg | INTRAMUSCULAR | Status: DC | PRN

## 2011-06-06 MED ORDER — ENOXAPARIN SODIUM 40 MG/0.4ML ~~LOC~~ SOLN
40.0000 mg | SUBCUTANEOUS | Status: DC
Start: 2011-06-07 — End: 2011-06-07
  Administered 2011-06-07: 40 mg via SUBCUTANEOUS
  Filled 2011-06-06 (×2): qty 0.4

## 2011-06-06 MED ORDER — HYDROCODONE-ACETAMINOPHEN 5-325 MG PO TABS
1.0000 | ORAL_TABLET | ORAL | Status: DC | PRN
  Administered 2011-06-06 (×2): 1 via ORAL
  Filled 2011-06-06 (×2): qty 1

## 2011-06-06 MED ORDER — GLYCOPYRROLATE 0.2 MG/ML IJ SOLN
INTRAMUSCULAR | Status: DC | PRN
  Administered 2011-06-06: .7 mg via INTRAVENOUS

## 2011-06-06 MED ORDER — NEOSTIGMINE METHYLSULFATE 1 MG/ML IJ SOLN
INTRAMUSCULAR | Status: DC | PRN
  Administered 2011-06-06: 5 mg via INTRAVENOUS

## 2011-06-06 MED ORDER — MIDAZOLAM HCL 2 MG/2ML IJ SOLN: 1.0000 mg | INTRAMUSCULAR | Status: DC | PRN

## 2011-06-06 MED ORDER — SODIUM CHLORIDE 0.9 % IV SOLN
INTRAVENOUS | Status: DC | PRN
  Administered 2011-06-06: 13:00:00

## 2011-06-06 MED ORDER — MIDAZOLAM HCL 5 MG/5ML IJ SOLN
INTRAMUSCULAR | Status: DC | PRN
  Administered 2011-06-06: 2 mg via INTRAVENOUS

## 2011-06-06 MED ORDER — LACTATED RINGERS IV SOLN
INTRAVENOUS | Status: DC
  Administered 2011-06-06: 12:00:00 via INTRAVENOUS

## 2011-06-06 MED ORDER — ONDANSETRON HCL 4 MG/2ML IJ SOLN
INTRAMUSCULAR | Status: DC | PRN
  Administered 2011-06-06: 4 mg via INTRAVENOUS

## 2011-06-06 MED ORDER — HYDROMORPHONE HCL PF 1 MG/ML IJ SOLN
0.2500 mg | INTRAMUSCULAR | Status: DC | PRN
  Administered 2011-06-06 (×4): 0.5 mg via INTRAVENOUS

## 2011-06-06 MED ORDER — LACTATED RINGERS IV SOLN
INTRAVENOUS | Status: DC | PRN
  Administered 2011-06-06 (×3): via INTRAVENOUS

## 2011-06-06 MED ORDER — LORAZEPAM 2 MG/ML IJ SOLN: 1.0000 mg | Freq: Once | INTRAMUSCULAR | Status: DC | PRN

## 2011-06-06 MED ORDER — SODIUM CHLORIDE 0.9 % IR SOLN
Status: DC | PRN
  Administered 2011-06-06: 1000 mL

## 2011-06-06 SURGICAL SUPPLY — 49 items
APPLIER CLIP 5 13 M/L LIGAMAX5 (MISCELLANEOUS) ×2
APPLIER CLIP ROT 10 11.4 M/L (STAPLE)
BLADE SURG ROTATE 9660 (MISCELLANEOUS) IMPLANT
CANISTER SUCTION 2500CC (MISCELLANEOUS) ×2 IMPLANT
CHLORAPREP W/TINT 26ML (MISCELLANEOUS) ×2 IMPLANT
CLIP APPLIE 5 13 M/L LIGAMAX5 (MISCELLANEOUS) ×1 IMPLANT
CLIP APPLIE ROT 10 11.4 M/L (STAPLE) IMPLANT
CLOTH BEACON ORANGE TIMEOUT ST (SAFETY) ×2 IMPLANT
COVER MAYO STAND STRL (DRAPES) ×2 IMPLANT
COVER SURGICAL LIGHT HANDLE (MISCELLANEOUS) ×2 IMPLANT
DECANTER SPIKE VIAL GLASS SM (MISCELLANEOUS) ×4 IMPLANT
DERMABOND ADHESIVE PROPEN (GAUZE/BANDAGES/DRESSINGS) ×1
DERMABOND ADVANCED (GAUZE/BANDAGES/DRESSINGS) ×1
DERMABOND ADVANCED .7 DNX12 (GAUZE/BANDAGES/DRESSINGS) ×1 IMPLANT
DERMABOND ADVANCED .7 DNX6 (GAUZE/BANDAGES/DRESSINGS) ×1 IMPLANT
DRAPE C-ARM 42X72 X-RAY (DRAPES) ×2 IMPLANT
DRAPE UTILITY 15X26 W/TAPE STR (DRAPE) ×4 IMPLANT
ELECT REM PT RETURN 9FT ADLT (ELECTROSURGICAL) ×2
ELECTRODE REM PT RTRN 9FT ADLT (ELECTROSURGICAL) ×1 IMPLANT
GLOVE BIO SURGEON STRL SZ7.5 (GLOVE) ×2 IMPLANT
GLOVE BIOGEL PI IND STRL 7.0 (GLOVE) ×1 IMPLANT
GLOVE BIOGEL PI IND STRL 7.5 (GLOVE) ×1 IMPLANT
GLOVE BIOGEL PI IND STRL 8 (GLOVE) ×1 IMPLANT
GLOVE BIOGEL PI INDICATOR 7.0 (GLOVE) ×1
GLOVE BIOGEL PI INDICATOR 7.5 (GLOVE) ×1
GLOVE BIOGEL PI INDICATOR 8 (GLOVE) ×1
GLOVE ECLIPSE 6.5 STRL STRAW (GLOVE) ×2 IMPLANT
GLOVE ECLIPSE 7.5 STRL STRAW (GLOVE) ×2 IMPLANT
GOWN STRL NON-REIN LRG LVL3 (GOWN DISPOSABLE) ×4 IMPLANT
KIT BASIN OR (CUSTOM PROCEDURE TRAY) ×2 IMPLANT
KIT ROOM TURNOVER OR (KITS) ×2 IMPLANT
NS IRRIG 1000ML POUR BTL (IV SOLUTION) ×2 IMPLANT
PAD ARMBOARD 7.5X6 YLW CONV (MISCELLANEOUS) ×4 IMPLANT
POUCH SPECIMEN RETRIEVAL 10MM (ENDOMECHANICALS) ×2 IMPLANT
SCISSORS LAP 5X35 DISP (ENDOMECHANICALS) IMPLANT
SET CHOLANGIOGRAPH 5 50 .035 (SET/KITS/TRAYS/PACK) ×2 IMPLANT
SET IRRIG TUBING LAPAROSCOPIC (IRRIGATION / IRRIGATOR) ×2 IMPLANT
SLEEVE ENDOPATH XCEL 5M (ENDOMECHANICALS) ×4 IMPLANT
SPECIMEN JAR SMALL (MISCELLANEOUS) ×2 IMPLANT
STRIP CLOSURE SKIN 1/2X4 (GAUZE/BANDAGES/DRESSINGS) ×2 IMPLANT
SUT MNCRL AB 4-0 PS2 18 (SUTURE) ×2 IMPLANT
TOWEL OR 17X24 6PK STRL BLUE (TOWEL DISPOSABLE) ×2 IMPLANT
TOWEL OR 17X26 10 PK STRL BLUE (TOWEL DISPOSABLE) ×2 IMPLANT
TOWEL OR NON WOVEN STRL DISP B (DISPOSABLE) ×2 IMPLANT
TRAY LAPAROSCOPIC (CUSTOM PROCEDURE TRAY) ×2 IMPLANT
TROCAR XCEL BLUNT TIP 100MML (ENDOMECHANICALS) ×2 IMPLANT
TROCAR XCEL NON-BLD 11X100MML (ENDOMECHANICALS) IMPLANT
TROCAR XCEL NON-BLD 5MMX100MML (ENDOMECHANICALS) ×2 IMPLANT
WATER STERILE IRR 1000ML POUR (IV SOLUTION) IMPLANT

## 2011-06-06 NOTE — Progress Notes (Signed)
Received from PACU post cholecystectomy. Alert and oriented, not in any distress, Lap sites CDI.

## 2011-06-06 NOTE — Progress Notes (Signed)
[facility] Progress Note  Subjective: Pain is improved  Objective: Vital signs in last 24 hours: Temp:  [98.1 F (36.7 C)-98.2 F (36.8 C)] 98.2 F (36.8 C) (05/13 0557) Pulse Rate:  [76-89] 78  (05/13 0557) Resp:  [12-18] 12  (05/13 0728) BP: (116-119)/(60-75) 118/60 mmHg (05/13 0557) SpO2:  [97 %-99 %] 98 % (05/13 0728) Weight change:    PE: Unchanged  Lab Results: Results for orders placed during the hospital encounter of 06/02/11 (from the past 24 hour(s))  MRSA PCR SCREENING     Status: Normal   Collection Time   06/06/11  4:34 AM      Component Value Range   MRSA by PCR NEGATIVE  NEGATIVE   CBC     Status: Abnormal   Collection Time   06/06/11  6:50 AM      Component Value Range   WBC 5.5  4.0 - 10.5 (K/uL)   RBC 3.72 (*) 3.87 - 5.11 (MIL/uL)   Hemoglobin 11.6 (*) 12.0 - 15.0 (g/dL)   HCT 40.9 (*) 81.1 - 46.0 (%)   MCV 93.3  78.0 - 100.0 (fL)   MCH 31.2  26.0 - 34.0 (pg)   MCHC 33.4  30.0 - 36.0 (g/dL)   RDW 91.4  78.2 - 95.6 (%)   Platelets 186  150 - 400 (K/uL)  DIFFERENTIAL     Status: Abnormal   Collection Time   06/06/11  6:50 AM      Component Value Range   Neutrophils Relative 59  43 - 77 (%)   Neutro Abs 3.2  1.7 - 7.7 (K/uL)   Lymphocytes Relative 26  12 - 46 (%)   Lymphs Abs 1.4  0.7 - 4.0 (K/uL)   Monocytes Relative 9  3 - 12 (%)   Monocytes Absolute 0.5  0.1 - 1.0 (K/uL)   Eosinophils Relative 6 (*) 0 - 5 (%)   Eosinophils Absolute 0.3  0.0 - 0.7 (K/uL)   Basophils Relative 1  0 - 1 (%)   Basophils Absolute 0.0  0.0 - 0.1 (K/uL)  COMPREHENSIVE METABOLIC PANEL     Status: Abnormal   Collection Time   06/06/11  6:50 AM      Component Value Range   Sodium 138  135 - 145 (mEq/L)   Potassium 3.8  3.5 - 5.1 (mEq/L)   Chloride 102  96 - 112 (mEq/L)   CO2 23  19 - 32 (mEq/L)   Glucose, Bld 77  70 - 99 (mg/dL)   BUN <3 (*) 6 - 23 (mg/dL)   Creatinine, Ser 2.13  0.50 - 1.10 (mg/dL)   Calcium 8.8  8.4 - 08.6 (mg/dL)   Total Protein 6.2   6.0 - 8.3 (g/dL)   Albumin 3.3 (*) 3.5 - 5.2 (g/dL)   AST 36  0 - 37 (U/L)   ALT 48 (*) 0 - 35 (U/L)   Alkaline Phosphatase 164 (*) 39 - 117 (U/L)   Total Bilirubin 1.9 (*) 0.3 - 1.2 (mg/dL)   GFR calc non Af Amer >90  >90 (mL/min)   GFR calc Af Amer >90  >90 (mL/min)  LIPASE, BLOOD     Status: Normal   Collection Time   06/06/11  6:50 AM      Component Value Range   Lipase 54  11 - 59 (U/L)    Studies/Results: No results found.    Assessment: Gallstone pancreatitis with following liver function tests.  Plan: To OR today. Followup ERCP if  persistent common duct stone found.    [name] C 06/06/2011, 9:18 AM

## 2011-06-06 NOTE — Progress Notes (Signed)
Patient to OR

## 2011-06-06 NOTE — Anesthesia Postprocedure Evaluation (Signed)
  Anesthesia Post-op Note  Patient: [name]  Procedure(s) Performed: Procedure(s) (LRB): LAPAROSCOPIC CHOLECYSTECTOMY WITH INTRAOPERATIVE CHOLANGIOGRAM (N/A)  Patient Location: PACU  Anesthesia Type: General  Level of Consciousness: awake  Airway and Oxygen Therapy: Patient Spontanous Breathing  Post-op Pain: mild  Post-op Assessment: Post-op Vital signs reviewed, Patient's Cardiovascular Status Stable, Respiratory Function Stable, Patent Airway, No signs of Nausea or vomiting and Pain level controlled  Post-op Vital Signs: stable  Complications: No apparent anesthesia complications

## 2011-06-06 NOTE — Anesthesia Preprocedure Evaluation (Signed)
Anesthesia Evaluation  Patient identified by MRN, date of birth, ID band Patient awake    Reviewed: Allergy & Precautions, H&P , NPO status , Patient's Chart, lab work & pertinent test results  Airway Mallampati: I TM Distance: >3 FB Neck ROM: Full    Dental   Pulmonary    Pulmonary exam normal       Cardiovascular     Neuro/Psych    GI/Hepatic PUD,   Endo/Other    Renal/GU      Musculoskeletal   Abdominal   Peds  Hematology   Anesthesia Other Findings   Reproductive/Obstetrics                           Anesthesia Physical Anesthesia Plan  ASA: II  Anesthesia Plan: General   Post-op Pain Management:    Induction: Intravenous  Airway Management Planned: Oral ETT  Additional Equipment:   Intra-op Plan:   Post-operative Plan: Extubation in OR  Informed Consent: I have reviewed the patients History and Physical, chart, labs and discussed the procedure including the risks, benefits and alternatives for the proposed anesthesia with the patient or authorized representative who has indicated his/her understanding and acceptance.     Plan Discussed with: CRNA and Surgeon  Anesthesia Plan Comments:         Anesthesia Quick Evaluation

## 2011-06-06 NOTE — Op Note (Signed)
OPERATIVE REPORT  DATE OF OPERATION: 06/02/2011 - 06/06/2011  PATIENT:  [name]  26 y.o. female  PRE-OPERATIVE DIAGNOSIS:  Gallstone Pancreatitis  POST-OPERATIVE DIAGNOSIS:  Gallstone Pancreatitis  PROCEDURE:  Procedure(s): LAPAROSCOPIC CHOLECYSTECTOMY WITH INTRAOPERATIVE CHOLANGIOGRAM  SURGEON:  Surgeon(s): Cherylynn Ridges, MD  ASSISTANT: None  ANESTHESIA:   general  EBL: <30 ml  BLOOD ADMINISTERED: none  DRAINS: none   SPECIMEN:  Source of Specimen:  gallbladder and stones  COUNTS CORRECT:  YES  PROCEDURE DETAILS: The patient was taken to the operating room and placed on the table in the supine position.  After an adequate endotracheal anesthetic was administered, she was prepped with ChloroPrep, and then draped in the usual manner exposing the entire abdomen laterally, inferiorly and up  to the costal margins.  After a proper timeout was performed including identifying the patient and the procedure to be performed, an infra-umbilical1.5cm midline incision was made using a #15 blade.  This was taken down to the fascia which was then incised with a #15 blade.  The edges of the fascia were tented up with Kocher clamps as the preperitoneal space was penetrated with a Kelly clamp into the peritoneum.  Once this was done, a pursestring suture of 0 Vicryl was passed around the fascial opening.  This was subsequently used to secure the [facility] cannula which was passed into the peritoneal cavity.  Once the [facility] cannula was in place, carbon dioxide gas was insufflated into the peritoneal cavity up to a maximal intra-abdominal pressure of 15mm Hg.The laparoscope, with attached camera and light source, was passed into the peritoneal cavity to visualize the direct insertion of two right upper quadrant 5mm cannulas, and a sup-xiphoid (28mm/10-11mm) cannula.  Once all cannulas were in place, the dissection was begun.  Two ratcheted graspers were attached to the dome and infundibulum of the  gallbladder and retracted towards the anterior abdominal wall and the right upper quadrant.  The gallbladder was distended but did not rupture during the retraction or dissection.  Using cautery attached to a dissecting forceps, the peritoneum overlaying the triangle of Chalot and the hepatoduodenal triangle was dissected away exposing the cystic duct and the cystic artery.  A clip was placed on the gallbladder side of the cystic duct, then a cholecytodochotomy made using the laparoscopic scissors.  Through the cholecystodochotomy a Cook catheter was passed to performed a cholangiogram.  The cholangiogram showed a mildly dilated CBD, no intra-ductal filling defects, good proximal flow and easy passage into the duodenum..  Once the cholangiogram was completed, the [facility] catheter was removed, and the distal cystic duct was clipped multiple times then transected.  The gallbladder was then dissected out of the hepatic bed without event.  It was retrieved from the abdomen using an EndoCatch bag having to open the gallbladder at the skin level to decompress it enough to remove it from the incision.  Once the gallbladder was removed, the bed was inspected for hemostasis.  Once excellent hemostasis was obtained all gas and fluids were aspirated from above the liver, then the cannulas were removed.  The infra-umbilical incision was closed using the pursestring suture which was in place.  0.25% bupivicaine with epinephrine was injected at all sites.  All 10mm or greater cannula sites were close using a running subcuticular stitch of 4-0 Monocryl.  5.31mm cannula sites were closed with Dermabond only.Steri-Strips and Tagaderm were used to complete the dressings at all sites.  At this point all needle, sponge, and instrument counts  were correct.The patient was awakened from anesthesia and taken to the PACU in stable condition.   PATIENT DISPOSITION:  PACU - hemodynamically stable.   Carrera Kiesel III,Artavis Cowie O 5/13/20132:12  PM

## 2011-06-06 NOTE — Transfer of Care (Signed)
Immediate Anesthesia Transfer of Care Note  Patient: [name]  Procedure(s) Performed: Procedure(s) (LRB): LAPAROSCOPIC CHOLECYSTECTOMY WITH INTRAOPERATIVE CHOLANGIOGRAM (N/A)  Patient Location: PACU  Anesthesia Type: General  Level of Consciousness: awake and alert   Airway & Oxygen Therapy: Patient Spontanous Breathing and Patient connected to nasal cannula oxygen  Post-op Assessment: Report given to PACU RN and Post -op Vital signs reviewed and stable  Post vital signs: Reviewed and stable  Complications: No apparent anesthesia complications

## 2011-06-06 NOTE — Progress Notes (Signed)
[facility] Progress Note    Subjective: Patient still feels better.  Morning labs are not back yet.  Will need to see what Tbili is .  Less pain.  Objective: Vital signs in last 24 hours: Temp:  [98.1 F (36.7 C)-98.2 F (36.8 C)] 98.2 F (36.8 C) (05/13 0557) Pulse Rate:  [76-89] 78  (05/13 0557) Resp:  [12-18] 12  (05/13 0728) BP: (116-119)/(60-75) 118/60 mmHg (05/13 0557) SpO2:  [97 %-99 %] 98 % (05/13 0728) Last BM Date: 05/30/11  Intake/Output from previous day: 05/12 0701 - 05/13 0700 In: 2460 [P.O.:960; I.V.:1500] Out: 2800 [Urine:2800] Intake/Output this shift:    General: No acute distress  Lungs: Clear  Abd: Soft, few bowel sounds.  Extremities: No edema  Neuro: Intact  Lab Results:  @LABLAST2 (wbc:2,hgb:2,hct:2,plt:2) BMET  Basename 06/04/11 2244 06/04/11 0710  NA 136 137  K 4.5 5.0  CL 105 102  CO2 18* 18*  GLUCOSE 54* 63*  BUN 6 7  CREATININE 0.73 0.67  CALCIUM 9.1 9.2   PT/INR Not available  Studies/Results: No results found.  Anti-infectives: Anti-infectives     Start     Dose/Rate Route Frequency Ordered Stop   06/06/11 0800   ceFAZolin (ANCEF) IVPB 2 g/50 mL premix        2 g 100 mL/hr over 30 Minutes Intravenous 60 min pre-op 06/05/11 1154            Assessment/Plan: s/p Procedure(s): LAPAROSCOPIC CHOLECYSTECTOMY WITH INTRAOPERATIVE CHOLANGIOGRAM OR if Tbili is down, ERCP if Tbili is up or the same  LOS: 4 days   Marta Lamas. Gae Bon, MD, FACS [phone] [facility] 06/06/2011

## 2011-06-07 ENCOUNTER — Encounter (HOSPITAL_COMMUNITY): Payer: Self-pay | Admitting: General Surgery

## 2011-06-07 LAB — DIFFERENTIAL
Eosinophils Absolute: 0.3 10*3/uL (ref 0.0–0.7)
Eosinophils Relative: 5 % (ref 0–5)
Lymphs Abs: 2 10*3/uL (ref 0.7–4.0)
Monocytes Absolute: 0.7 10*3/uL (ref 0.1–1.0)
Monocytes Relative: 12 % (ref 3–12)

## 2011-06-07 LAB — COMPREHENSIVE METABOLIC PANEL
ALT: 40 U/L — ABNORMAL HIGH (ref 0–35)
AST: 33 U/L (ref 0–37)
Albumin: 3 g/dL — ABNORMAL LOW (ref 3.5–5.2)
Calcium: 8.7 mg/dL (ref 8.4–10.5)
Creatinine, Ser: 0.7 mg/dL (ref 0.50–1.10)
Sodium: 139 mEq/L (ref 135–145)

## 2011-06-07 LAB — CBC
HCT: 33.4 % — ABNORMAL LOW (ref 36.0–46.0)
Hemoglobin: 11 g/dL — ABNORMAL LOW (ref 12.0–15.0)
MCH: 30.3 pg (ref 26.0–34.0)
MCHC: 32.9 g/dL (ref 30.0–36.0)
MCV: 92 fL (ref 78.0–100.0)
RBC: 3.63 MIL/uL — ABNORMAL LOW (ref 3.87–5.11)

## 2011-06-07 MED ORDER — HYDROCODONE-ACETAMINOPHEN 5-325 MG PO TABS: 1.0000 | ORAL_TABLET | ORAL | Status: AC | PRN

## 2011-06-07 NOTE — Progress Notes (Signed)
Patient discharged to home in care of spouse. Medications and instructions reviewed with patient and spouse with no questions. IV d/c'd with cath intact. Assessment unchanged from this am. Patient is to follow up with Dr. Lindie Spruce in 3 weeks.

## 2011-06-07 NOTE — Discharge Summary (Signed)
  Patient ID: Kiffany Schelling Dorn MRN: [id number] DOB/AGE: 09-14-1985 26 y.o.  Admit date: 06/02/2011 Discharge date: 06/07/2011  Procedures: lap chole  Consults: GI  Reason for Admission: this is a 26 yo female who presented to the [facility] with c/o RUQ and epigastric pain and was found to have biliary pancreatitis.  Please see H&P for further details.  Admission Diagnoses:  1. Biliary pancreatitis  Hospital Course: The patient was admitted and placed on bowel rest.  GI was consulted for possible ERCP for possible CBD stones.  Her labs began to trend up, but an ERCP was held off.  Her labs then began to trend back down.  Her pancreatitis began to resolve.  She was finally ready for surgery after several days of bowel rest.  She went to the OR and had a lap chole.  She had an IOC that was negative.  She tolerated the procedure well.  The following day she was stable for discharge home as her pain was controlled and she was tolerating a regular diet.  Discharge Diagnoses:  Principal Problem:  *Gallstone pancreatitis s/p lap chole  Discharge Medications: Medication List  As of 06/07/2011  8:34 AM   STOP taking these medications         oxyCODONE 5 MG immediate release tablet         TAKE these medications         FENUGREEK PO   Take 2 tablets by mouth 3 (three) times daily.      HYDROcodone-acetaminophen 5-325 MG per tablet   Commonly known as: NORCO   Take 1-2 tablets by mouth every 4 (four) hours as needed.      prenatal multivitamin Tabs   Take 1 tablet by mouth daily.            Discharge Instructions: Follow-up Information    Follow up with WYATT Rene Kocher, MD. Schedule an appointment as soon as possible for a visit in 3 weeks.   Contact information:   [facility] [address] [place] [address] [phone]          Signed: Letha Cape 06/07/2011, 8:34 AM

## 2011-06-07 NOTE — Discharge Instructions (Signed)
[facility] ______[facility] °LAPAROSCOPIC SURGERY: POST OP INSTRUCTIONS °Always review your discharge instruction sheet given to you by the facility where your surgery was performed. °IF YOU HAVE DISABILITY OR FAMILY LEAVE FORMS, YOU MUST BRING THEM TO THE OFFICE FOR PROCESSING.   °DO NOT GIVE THEM TO YOUR DOCTOR. ° °1. A prescription for pain medication Ruegg be given to you upon discharge.  Take your pain medication as prescribed, if needed.  If narcotic pain medicine is not needed, then you Tucci take acetaminophen (Tylenol) or ibuprofen (Advil) as needed. °2. Take your usually prescribed medications unless otherwise directed. °3. If you need a refill on your pain medication, please contact your pharmacy.  They will contact our office to request authorization. Prescriptions will not be filled after 5pm or on week-ends. °4. You should follow a light diet the first few days after arrival home, such as soup and crackers, etc.  Be sure to include lots of fluids daily. °5. Most patients will experience some swelling and bruising in the area of the incisions.  Ice packs will help.  Swelling and bruising can take several days to resolve.  °6. It is common to experience some constipation if taking pain medication after surgery.  Increasing fluid intake and taking a stool softener (such as Colace) will usually help or prevent this problem from occurring.  A mild laxative (Milk of Magnesia or Miralax) should be taken according to package instructions if there are no bowel movements after 48 hours. °7. Unless discharge instructions indicate otherwise, you Laurie remove your bandages 24-48 hours after surgery, and you Cefalu shower at that time.  You Petrov have steri-strips (small skin tapes) in place directly over the incision.  These strips should be left on the skin for 7-10 days.  If your surgeon used skin glue on the incision, you Saputo shower in 24 hours.  The glue will flake off over the next 2-3 weeks.  Any sutures or  staples will be removed at the office during your follow-up visit. °8. ACTIVITIES:  You Pun resume regular (light) daily activities beginning the next day--such as daily self-care, walking, climbing stairs--gradually increasing activities as tolerated.  You Kasel have sexual intercourse when it is comfortable.  Refrain from any heavy lifting or straining until approved by your doctor. °a. You [address] drive when you are no longer taking prescription pain medication, you can comfortably wear a seatbelt, and you can safely maneuver your car and apply brakes. °b. RETURN TO WORK:  __________________________________________________________ °9. You should see your doctor in the office for a follow-up appointment approximately 2-3 weeks after your surgery.  Make sure that you call for this appointment within a day or two after you arrive home to insure a convenient appointment time. °10. OTHER INSTRUCTIONS: __________________________________________________________________________________________________________________________ __________________________________________________________________________________________________________________________ °WHEN TO CALL YOUR DOCTOR: °1. Fever over 101.0 °2. Inability to urinate °3. Continued bleeding from incision. °4. Increased pain, redness, or drainage from the incision. °5. Increasing abdominal pain ° °The clinic staff is available to answer your questions during regular business hours.  Please don’t hesitate to call and ask to speak to one of the nurses for clinical concerns.  If you have a medical emergency, go to the nearest emergency room or call 911.  A surgeon from [facility] is always on call at the hospital. °[address], [address], [place], [place]  [address] ? [address] [address], [place], [place]   [address] °[phone] ? [phone] ? FAX [phone] °Web site:   [email or web address] °

## 2011-06-07 NOTE — Progress Notes (Signed)
Okay to go home.  Patient feels better.  [name]. Gae Bon, MD, FACS [phone] [facility]

## 2011-06-07 NOTE — Discharge Summary (Signed)
Doing well enough to go home.  [name], III, MD, FACS [phone]--pager [phone]--office [facility]  

## 2011-06-07 NOTE — Progress Notes (Signed)
Patient ID: [name], female   DOB: 08-05-1985, 26 y.o.   MRN: [id number] 1 Day Post-Op  Subjective: Pt feeling better.  C/o soreness but not much pain.  Tolerating clears, getting ready to eat solid food.  Objective: Vital signs in last 24 hours: Temp:  [97.6 F (36.4 C)-98.2 F (36.8 C)] 98.1 F (36.7 C) (05/14 0555) Pulse Rate:  [62-118] 68  (05/14 0555) Resp:  [15-23] 18  (05/14 0555) BP: (121-141)/(68-87) 125/80 mmHg (05/14 0555) SpO2:  [95 %-100 %] 98 % (05/14 0555) Last BM Date: 05/30/11  Intake/Output from previous day: 05/13 0701 - 05/14 0700 In: 2486.3 [I.V.:2486.3] Out: 2250 [Urine:2250] Intake/Output this shift:    PE: Abd: soft, appropriately tender, incisions c/d/i, ND, +BS  Lab Results:   Basename 06/07/11 0500 06/06/11 0650  WBC 5.8 5.5  HGB 11.0* 11.6*  HCT 33.4* 34.7*  PLT 197 186   BMET  Basename 06/07/11 0500 06/06/11 0650  NA 139 138  K 3.7 3.8  CL 104 102  CO2 24 23  GLUCOSE 81 77  BUN <3* <3*  CREATININE 0.70 0.63  CALCIUM 8.7 8.8   PT/INR No results found for this basename: LABPROT:2,INR:2 in the last 72 hours CMP     Component Value Date/Time   NA 139 06/07/2011 0500   K 3.7 06/07/2011 0500   CL 104 06/07/2011 0500   CO2 24 06/07/2011 0500   GLUCOSE 81 06/07/2011 0500   BUN <3* 06/07/2011 0500   CREATININE 0.70 06/07/2011 0500   CALCIUM 8.7 06/07/2011 0500   PROT 5.7* 06/07/2011 0500   ALBUMIN 3.0* 06/07/2011 0500   AST 33 06/07/2011 0500   ALT 40* 06/07/2011 0500   ALKPHOS 130* 06/07/2011 0500   BILITOT 1.1 06/07/2011 0500   GFRNONAA >90 06/07/2011 0500   GFRAA >90 06/07/2011 0500   Lipase     Component Value Date/Time   LIPASE 74* 06/07/2011 0500       Studies/Results: Dg Cholangiogram Operative  06/06/2011  *RADIOLOGY REPORT*  Clinical Data:   Cholelithiasis, pancreatitis  INTRAOPERATIVE CHOLANGIOGRAM  Technique:  Cholangiographic images from the C-arm fluoroscopic device were submitted for interpretation post-operatively.   Please see the procedural report for the amount of contrast and the fluoroscopy time utilized.  Comparison:  None  Findings:  No persistent filling defects in the common duct. Intrahepatic ducts are incompletely visualized, appearing decompressed centrally. Contrast passes into the duodenum. There is some reflux of contrast into the nondilated pancreatic duct.  IMPRESSION  Negative for retained common duct stone.  Original Report Authenticated By: Osa Craver, M.D.    Anti-infectives: Anti-infectives     Start     Dose/Rate Route Frequency Ordered Stop   06/06/11 0800   ceFAZolin (ANCEF) IVPB 2 g/50 mL premix        2 g 100 mL/hr over 30 Minutes Intravenous 60 min pre-op 06/05/11 1154 06/06/11 1310           Assessment/Plan  1. Biliary pancreatitis 2. S/p lap chole  Plan: 1. Will dc home today after tolerating solid diet.  Follow up in 2-3 weeks.   LOS: 5 days    [name] E 06/07/2011

## 2011-07-14 ENCOUNTER — Telehealth (INDEPENDENT_AMBULATORY_CARE_PROVIDER_SITE_OTHER): Payer: Self-pay

## 2011-07-14 NOTE — Telephone Encounter (Signed)
Called patient for the 2nd time to get RTW date for her letter. Left 2  messages for her to call me back.

## 2011-07-14 NOTE — Telephone Encounter (Signed)
Message copied by Brennan Bailey on Thu Jul 14, 2011  5:03 PM ------      Message from: Marchia Bond      Created: Fri Jul 08, 2011  4:45 PM      Regarding: BACK TO WORK NOTE        DR Lindie Spruce PT CALLED EARLIER ABOUT A BACK TO WORK NOTE AND WAS TOLD TO GET THE FAX # TO HER WORK AND THEY WILL SEND IT THE FAX IS [phone] ATTN: MARTY WEBB THANKS

## 2011-08-02 ENCOUNTER — Encounter (INDEPENDENT_AMBULATORY_CARE_PROVIDER_SITE_OTHER): Payer: Self-pay | Admitting: General Surgery

## 2013-11-25 ENCOUNTER — Encounter (HOSPITAL_COMMUNITY): Payer: Self-pay | Admitting: General Surgery

## 2014-01-09 IMAGING — CT CT ABD-PELV W/ CM
2 of 4 series · 16 of 46 positions shown, 18 images · IV contrast (APPLIED)
Comparison: CT abdomen and pelvis 08/19/2003 and 05/14/2002.

CLINICAL DATA: Right upper quadrant abdominal pain.  Markedly
elevated serum lipase and mildly elevated liver function tests.
Leukocytosis.  History of ulcerative colitis.  6 weeks post-partum.

CT ABDOMEN AND PELVIS WITH CONTRAST 06/02/2011:
TECHNIQUE: Multidetector CT imaging of the abdomen and pelvis was
performed following the standard protocol during bolus
administration of intravenous contrast.
Contrast: 100mL OMNIPAQUE IOHEXOL 300 MG/ML. Oral contrast was also
administered.

[Series 2: abd/pelv with 5.0 b31f st · axial · 0.76mm/px · z∈[+929,+1384]mm · 13 of 99 slices shown, 15 images]
[im 4/99  soft-tissue]
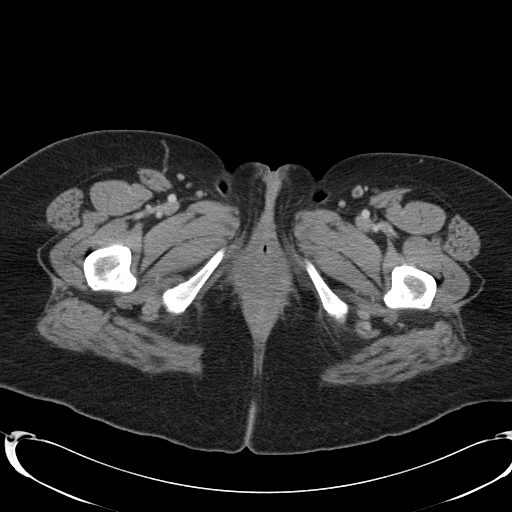
[im 4/99  bone]
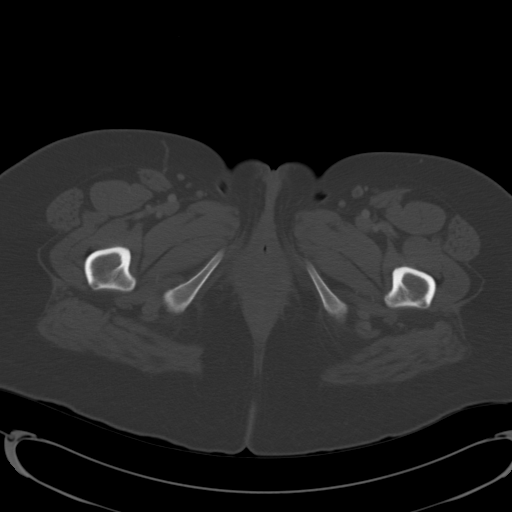
[im 12/99  soft-tissue]
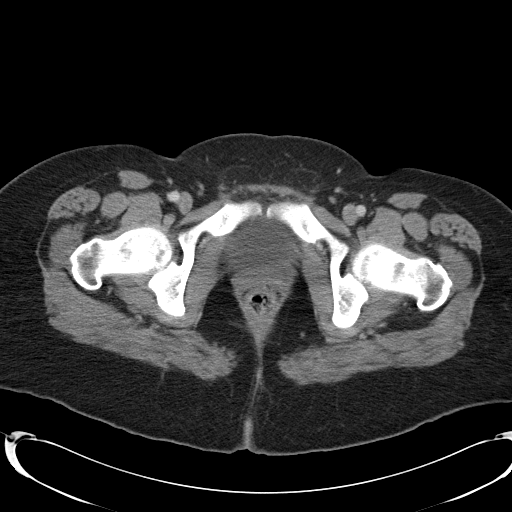
[im 20/99  soft-tissue]
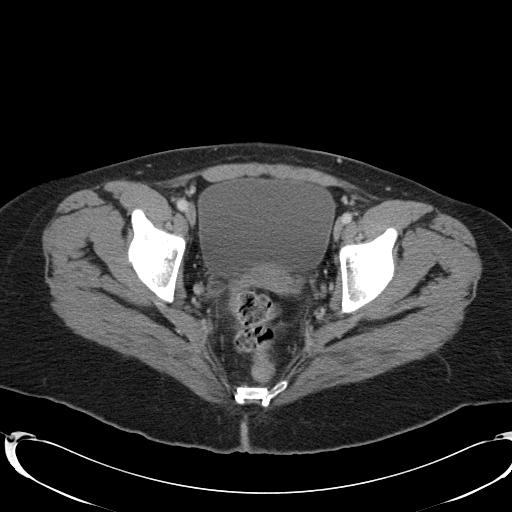
[im 28/99  soft-tissue]
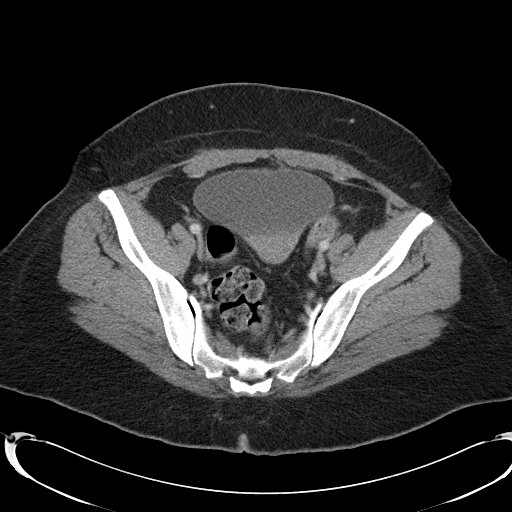
[im 36/99  soft-tissue]
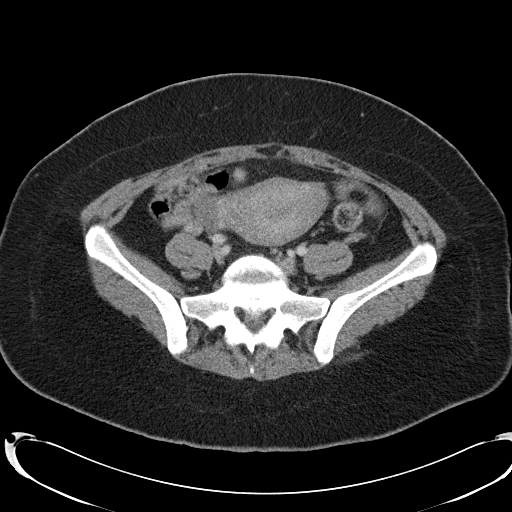
[im 44/99  soft-tissue]
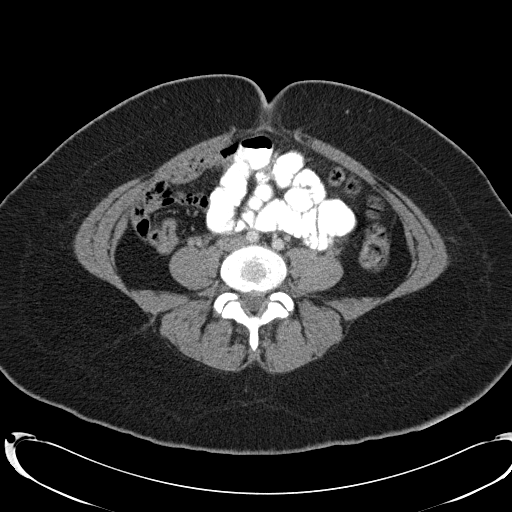
[im 51/99  soft-tissue]
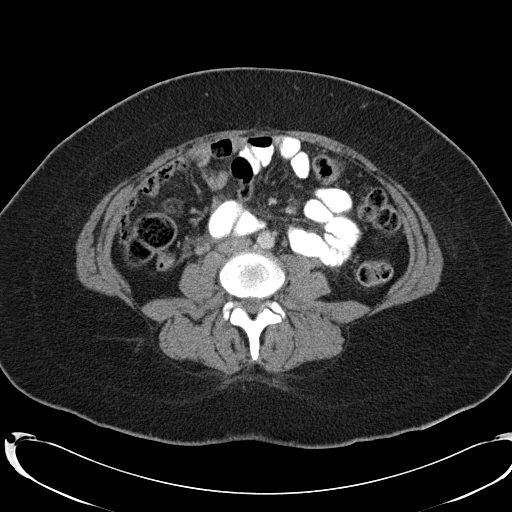
[im 55/99  soft-tissue]
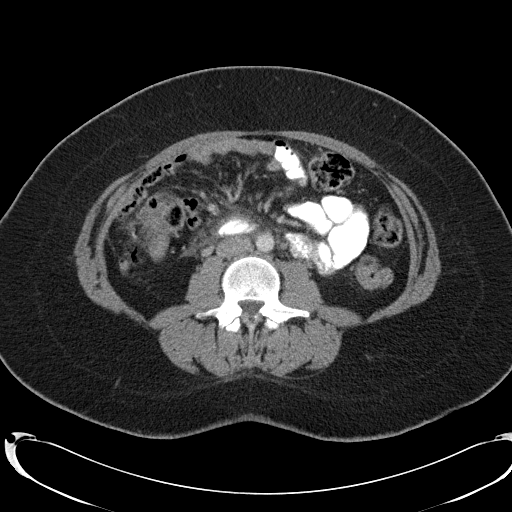
[im 63/99  soft-tissue]
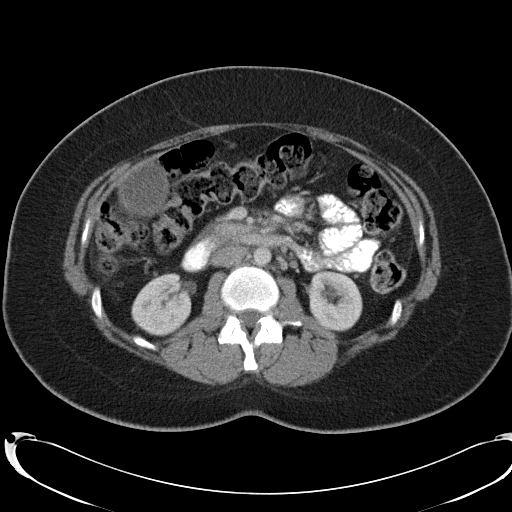
[im 63/99  bone]
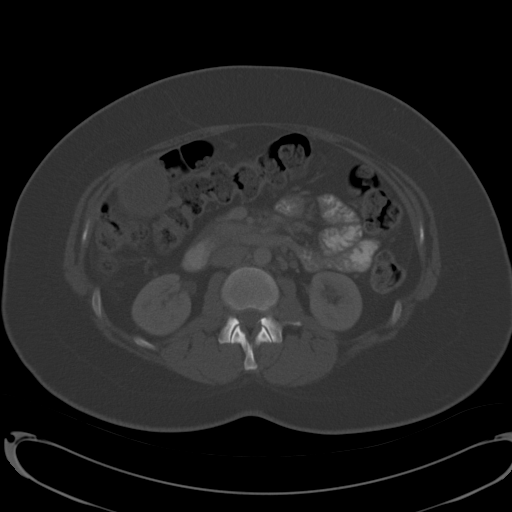
[im 71/99  soft-tissue]
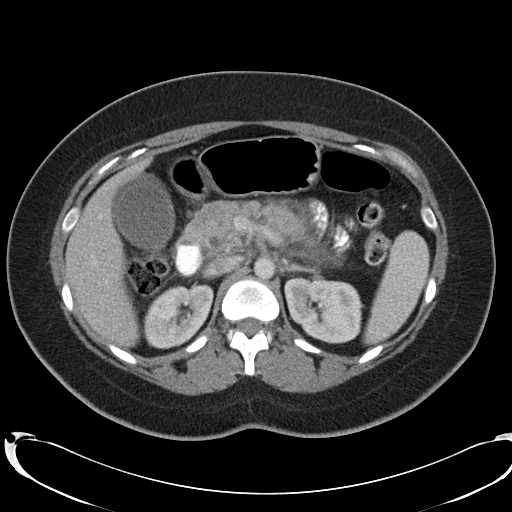
[im 79/99  soft-tissue]
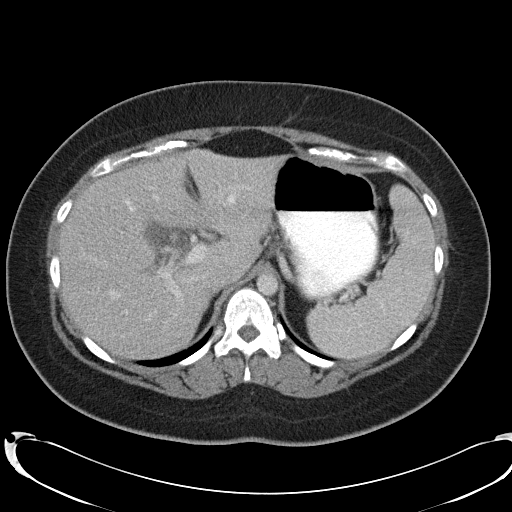
[im 87/99  soft-tissue]
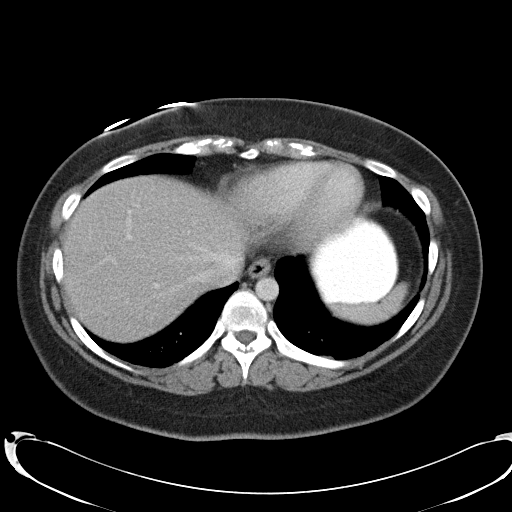
[im 95/99  soft-tissue]
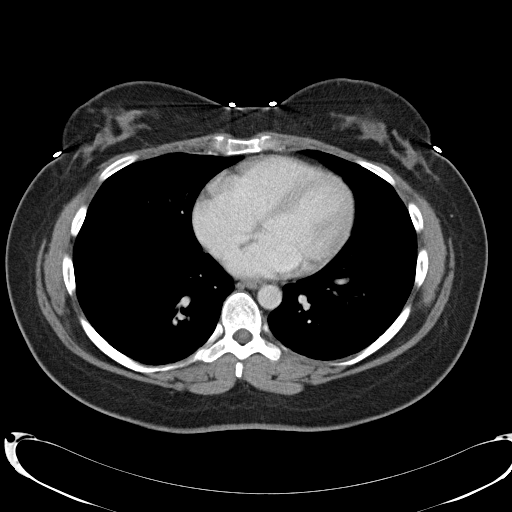

[Series 5: coronals · coronal · 0.96mm/px · 3 of 133 slices shown]
[im 45/133  soft-tissue]
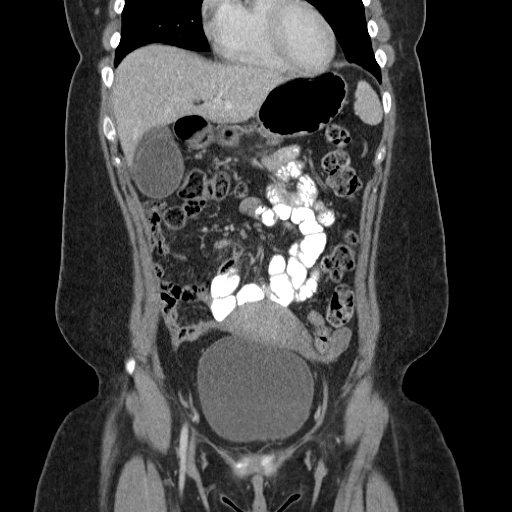
[im 59/133  soft-tissue]
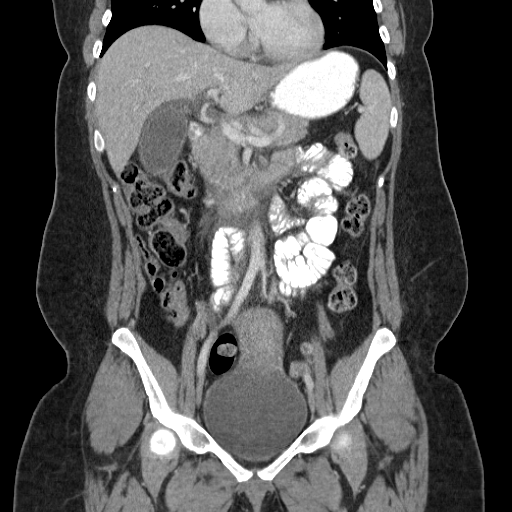
[im 74/133  soft-tissue]
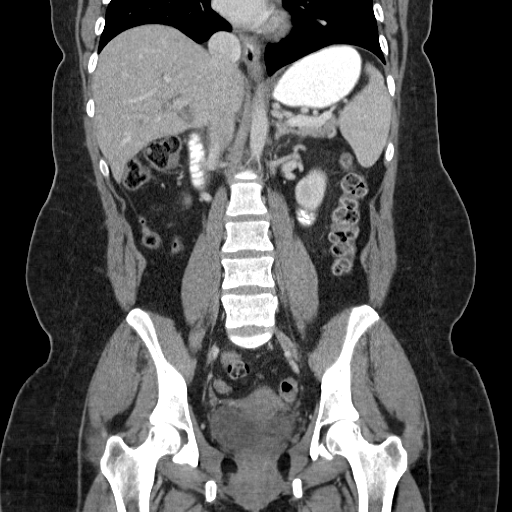

[16 of 46 positions shown; findings below may reference images not displayed]

FINDINGS: Edema involving the head, uncinate, and body of the
pancreas with associated peripancreatic edema/inflammation.  The
distal tail is uninvolved.  Normal pancreatic enhancement
throughout.  No cysts within the pancreas.  No free fluid within
the abdomen or pelvis.

Normal appearing liver, spleen, adrenal glands, and kidneys.
Thickened gallbladder wall up to approximately 5-6 mm without
calcified gallstones.  No biliary ductal dilation.  Filling defect
within the intrapancreatic portion of the distal common bile duct.
No visible aorto-iliofemoral atherosclerosis. Scattered normal-
sized retroperitoneal lymph nodes in the abdomen; no significant
lymphadenopathy.

Normal appearing stomach, small bowel, and colon.  Large stool
burden.  Cecum positioned in the right mid abdomen, and normal
appearing retrocecal appendix identified.

Uterus and ovaries unremarkable by CT.  Urinary bladder
unremarkable.  Bone window images demonstrate a vacuum phenomenon
in the sacroiliac joints but are otherwise unremarkable.
Visualized lung bases clear apart from the expected dependent
atelectasis posteriorly.  Heart size normal.
IMPRESSION: 1.  Acute pancreatitis involving the head, uncinate, and body of
the pancreas.  No evidence of pancreatic necrosis.  No pseudocyst.
2.  Gallstone suspected within the distal common bile duct, likely
the cause of the acute pancreatitis.
3.  Gallbladder wall thickening up to approximately 6 mm is
consistent with chronic cholecystitis, in the absence of
pericholecystic edema/inflammation at this time.
4.  Large stool burden.

## 2014-01-13 IMAGING — RF DG CHOLANGIOGRAM OPERATIVE
1 series · 6 of 6 positions shown · non-contrast
Comparison: None

CLINICAL DATA: Cholelithiasis, pancreatitis

INTRAOPERATIVE CHOLANGIOGRAM
TECHNIQUE: Cholangiographic images from the C-arm fluoroscopic
device were submitted for interpretation post-operatively.  Please
see the procedural report for the amount of contrast and the
fluoroscopy time utilized.

[Series 1: run · 3 acquisitions, 6 frames shown]
[im 1/3]
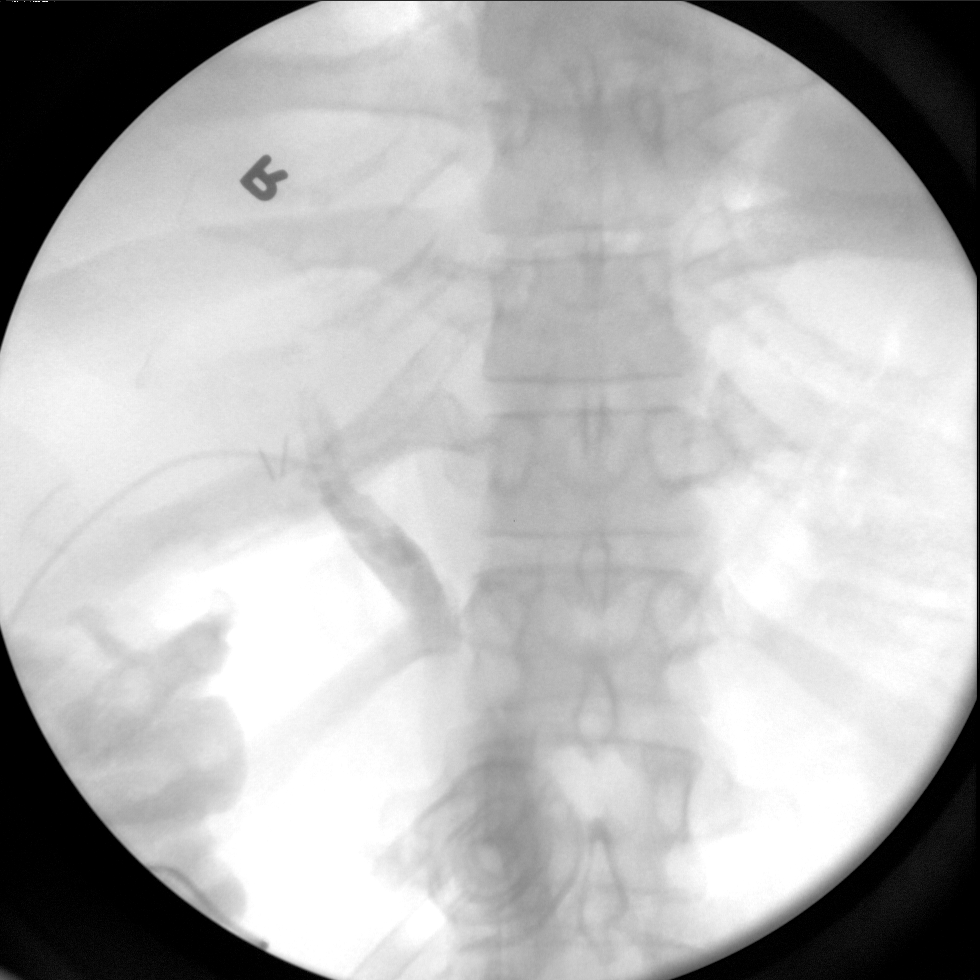
[im 1/3]
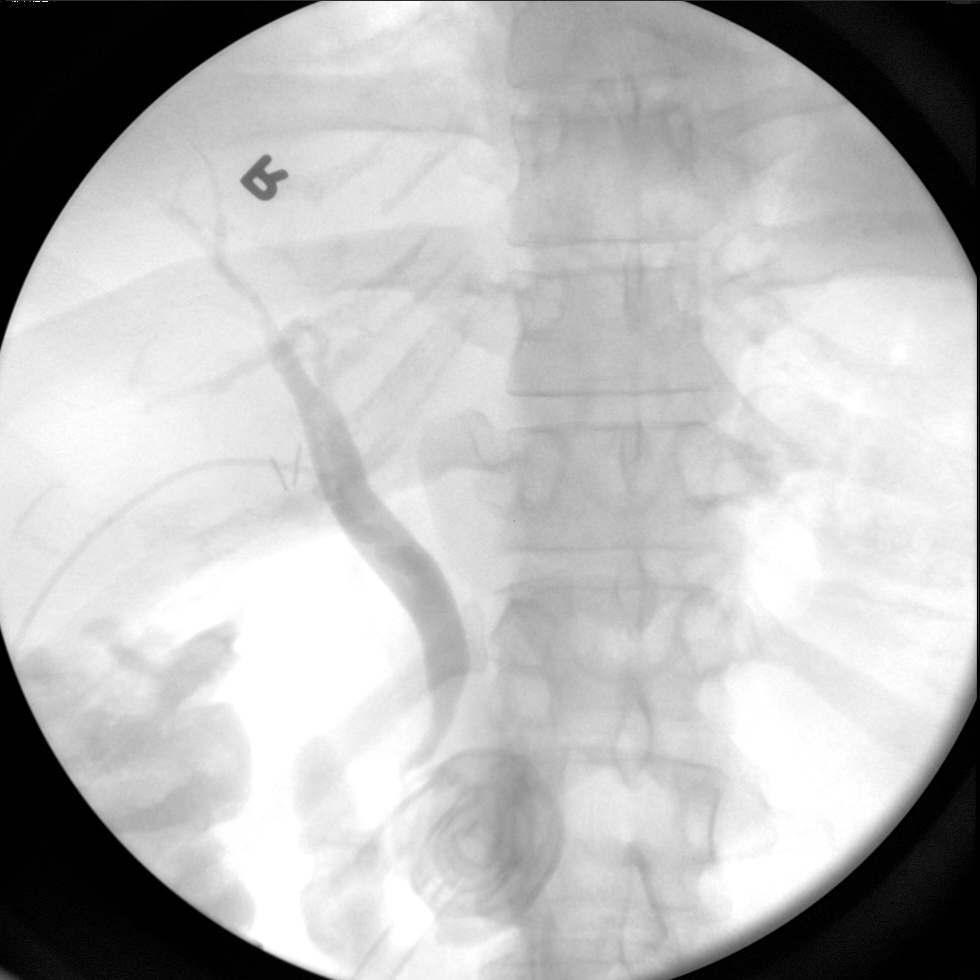
[im 1/3]
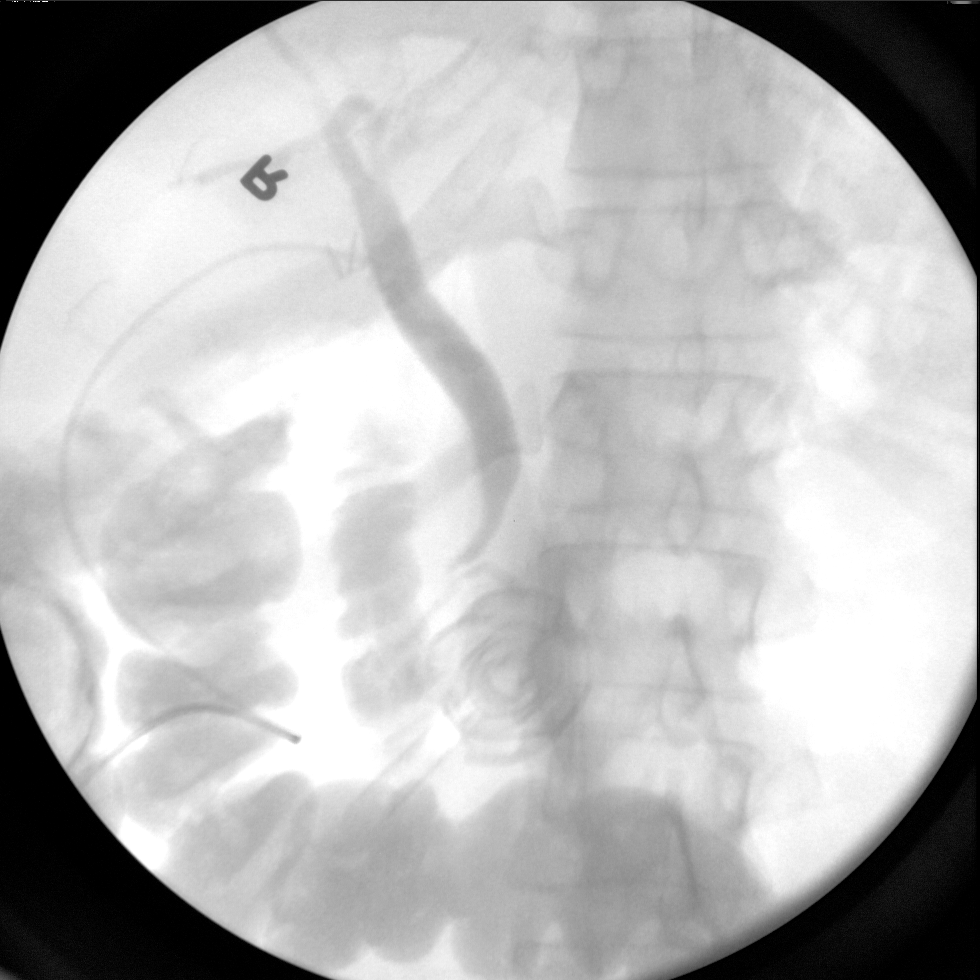
[im 1/3]
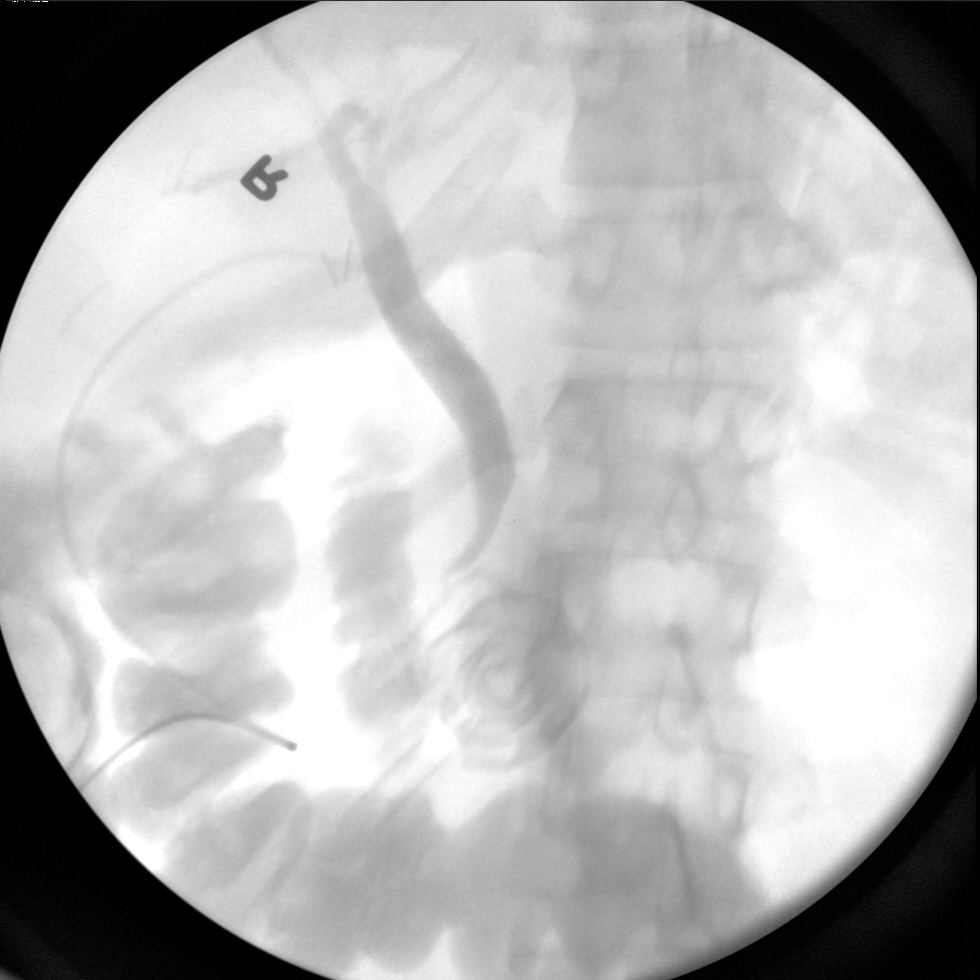
[im 2/3]
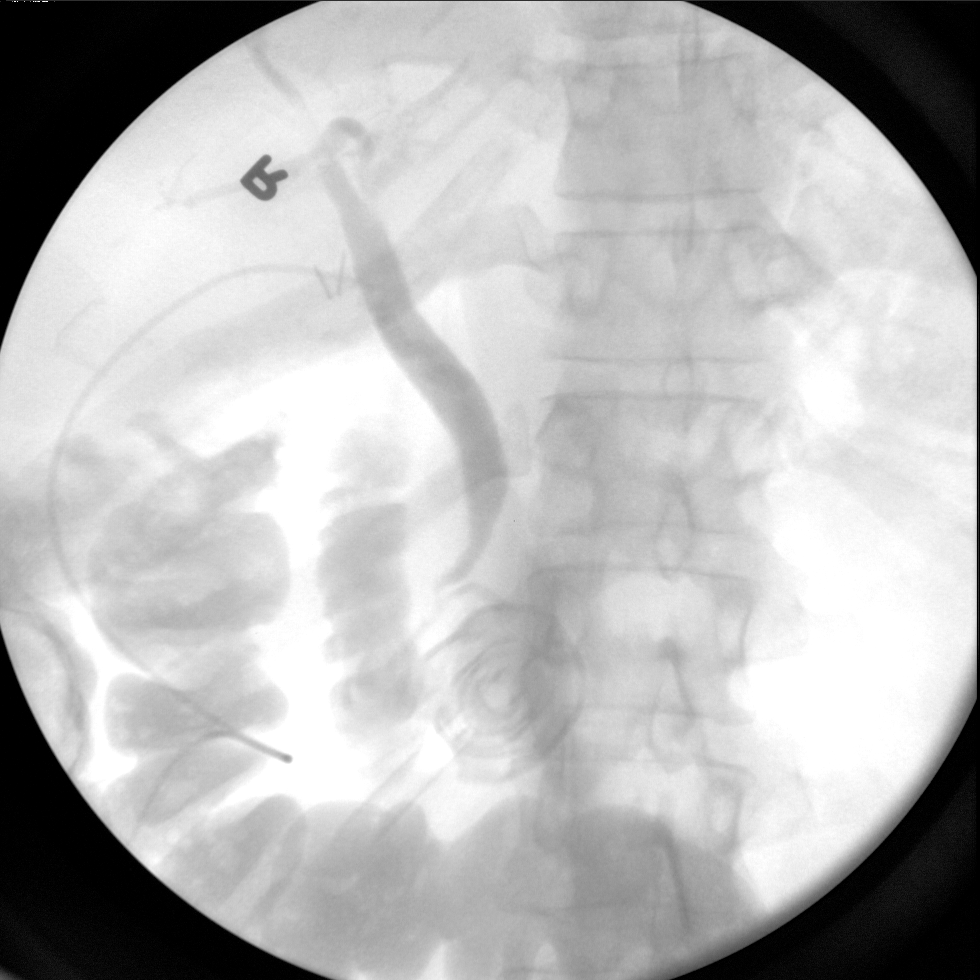
[im 3/3]
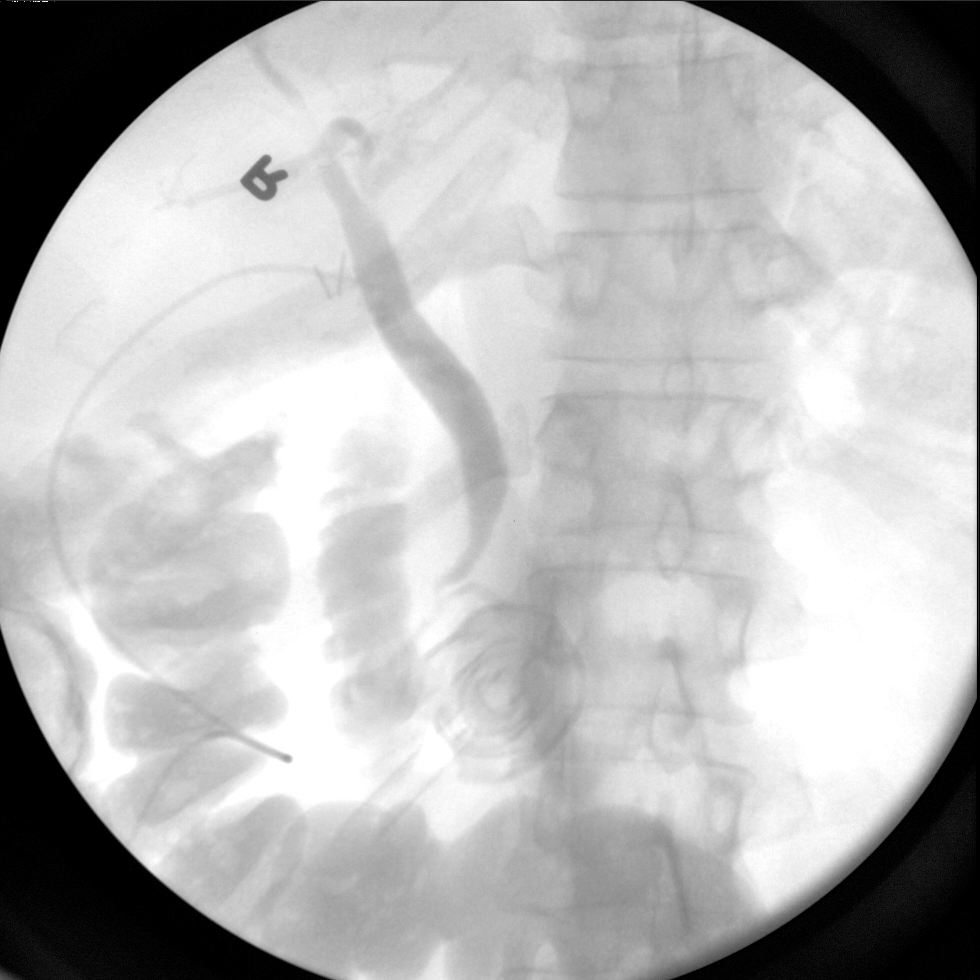

[6 of 6 positions shown; findings below may reference images not displayed]

FINDINGS: No persistent filling defects in the common duct.
Intrahepatic ducts are incompletely visualized, appearing
decompressed centrally. Contrast passes into the duodenum. There is
some reflux of contrast into the nondilated pancreatic duct.

IMPRESSION

Negative for retained common duct stone.

## 2014-01-23 LAB — OB RESULTS CONSOLE RUBELLA ANTIBODY, IGM: Rubella: IMMUNE

## 2014-01-23 LAB — OB RESULTS CONSOLE HEPATITIS B SURFACE ANTIGEN: HEP B S AG: NEGATIVE

## 2014-08-04 ENCOUNTER — Inpatient Hospital Stay (HOSPITAL_COMMUNITY): Payer: BLUE CROSS/BLUE SHIELD | Admitting: Anesthesiology

## 2014-08-04 ENCOUNTER — Encounter (HOSPITAL_COMMUNITY)
Admission: AD | Disposition: A | Discharge status: Home / Self Care | Payer: Self-pay | Source: Ambulatory Visit | Attending: Obstetrics and Gynecology

## 2014-08-04 ENCOUNTER — Inpatient Hospital Stay (HOSPITAL_COMMUNITY)
Admission: RE | Admit: 2014-08-04 | Payer: BLUE CROSS/BLUE SHIELD | Source: Ambulatory Visit | Admitting: Obstetrics and Gynecology

## 2014-08-04 ENCOUNTER — Inpatient Hospital Stay (HOSPITAL_COMMUNITY)
Admission: AD | Admit: 2014-08-04 | Discharge: 2014-08-06 | DRG: 765 | Disposition: A | Discharge status: Home / Self Care | Payer: BLUE CROSS/BLUE SHIELD | Source: Ambulatory Visit | Attending: Obstetrics and Gynecology | Admitting: Obstetrics and Gynecology

## 2014-08-04 ENCOUNTER — Encounter (HOSPITAL_COMMUNITY): Payer: Self-pay | Admitting: Obstetrics and Gynecology

## 2014-08-04 DIAGNOSIS — O3421 Maternal care for scar from previous cesarean delivery: Principal | ICD-10-CM | POA: Diagnosis present

## 2014-08-04 DIAGNOSIS — Z98891 History of uterine scar from previous surgery: Secondary | ICD-10-CM

## 2014-08-04 DIAGNOSIS — O99824 Streptococcus B carrier state complicating childbirth: Secondary | ICD-10-CM | POA: Diagnosis present

## 2014-08-04 DIAGNOSIS — Z3A39 39 weeks gestation of pregnancy: Secondary | ICD-10-CM | POA: Diagnosis present

## 2014-08-04 DIAGNOSIS — K5190 Ulcerative colitis, unspecified, without complications: Secondary | ICD-10-CM | POA: Diagnosis present

## 2014-08-04 DIAGNOSIS — Z6841 Body Mass Index (BMI) 40.0 and over, adult: Secondary | ICD-10-CM

## 2014-08-04 DIAGNOSIS — O99214 Obesity complicating childbirth: Secondary | ICD-10-CM | POA: Diagnosis present

## 2014-08-04 DIAGNOSIS — O9962 Diseases of the digestive system complicating childbirth: Secondary | ICD-10-CM | POA: Diagnosis present

## 2014-08-04 DIAGNOSIS — Z3483 Encounter for supervision of other normal pregnancy, third trimester: Secondary | ICD-10-CM

## 2014-08-04 HISTORY — DX: Acquired absence of other specified parts of digestive tract: Z90.49

## 2014-08-04 HISTORY — DX: History of uterine scar from previous surgery: Z98.891

## 2014-08-04 HISTORY — DX: Encounter for supervision of other normal pregnancy, third trimester: Z34.83

## 2014-08-04 HISTORY — DX: Ulcerative colitis, unspecified, without complications: K51.90

## 2014-08-04 HISTORY — DX: Personal history of other diseases of the nervous system and sense organs: Z86.69

## 2014-08-04 HISTORY — DX: Papillomavirus as the cause of diseases classified elsewhere: B97.7

## 2014-08-04 LAB — CBC
HEMATOCRIT: 37.9 % (ref 36.0–46.0)
HEMOGLOBIN: 12.9 g/dL (ref 12.0–15.0)
MCH: 31.5 pg (ref 26.0–34.0)
MCHC: 34 g/dL (ref 30.0–36.0)
MCV: 92.7 fL (ref 78.0–100.0)
Platelets: 197 10*3/uL (ref 150–400)
RBC: 4.09 MIL/uL (ref 3.87–5.11)
RDW: 13.4 % (ref 11.5–15.5)
WBC: 12.2 10*3/uL — AB (ref 4.0–10.5)

## 2014-08-04 LAB — OB RESULTS CONSOLE GBS: GBS: POSITIVE

## 2014-08-04 LAB — ABO/RH: ABO/RH(D): A POS

## 2014-08-04 LAB — TYPE AND SCREEN
ABO/RH(D): A POS
Antibody Screen: NEGATIVE

## 2014-08-04 LAB — OB RESULTS CONSOLE GC/CHLAMYDIA
CHLAMYDIA, DNA PROBE: NEGATIVE
Gonorrhea: NEGATIVE

## 2014-08-04 LAB — OB RESULTS CONSOLE HIV ANTIBODY (ROUTINE TESTING): HIV: NONREACTIVE

## 2014-08-04 LAB — OB RESULTS CONSOLE RPR: RPR: NONREACTIVE

## 2014-08-04 SURGERY — Surgical Case
Anesthesia: Spinal | Site: Abdomen

## 2014-08-04 MED ORDER — KETOROLAC TROMETHAMINE 30 MG/ML IJ SOLN
30.0000 mg | Freq: Four times a day (QID) | INTRAMUSCULAR | Status: AC | PRN
  Administered 2014-08-05: 30 mg via INTRAMUSCULAR

## 2014-08-04 MED ORDER — NALBUPHINE HCL 10 MG/ML IJ SOLN: 5.0000 mg | Freq: Once | INTRAMUSCULAR | Status: AC | PRN

## 2014-08-04 MED ORDER — LACTATED RINGERS IV SOLN
INTRAVENOUS | Status: DC | PRN
  Administered 2014-08-04: 22:00:00 via INTRAVENOUS

## 2014-08-04 MED ORDER — OXYTOCIN 10 UNIT/ML IJ SOLN
40.0000 [IU] | INTRAVENOUS | Status: DC | PRN
  Administered 2014-08-04: 40 [IU] via INTRAVENOUS

## 2014-08-04 MED ORDER — FAMOTIDINE IN NACL 20-0.9 MG/50ML-% IV SOLN
20.0000 mg | Freq: Once | INTRAVENOUS | Status: AC
  Administered 2014-08-04: 20 mg via INTRAVENOUS
  Filled 2014-08-04: qty 50

## 2014-08-04 MED ORDER — ONDANSETRON HCL 4 MG/2ML IJ SOLN: 4.0000 mg | Freq: Once | INTRAMUSCULAR | Status: AC | PRN

## 2014-08-04 MED ORDER — CITRIC ACID-SODIUM CITRATE 334-500 MG/5ML PO SOLN
30.0000 mL | Freq: Once | ORAL | Status: AC
  Administered 2014-08-04: 30 mL via ORAL

## 2014-08-04 MED ORDER — KETOROLAC TROMETHAMINE 30 MG/ML IJ SOLN: 30.0000 mg | Freq: Four times a day (QID) | INTRAMUSCULAR | Status: AC | PRN

## 2014-08-04 MED ORDER — NALBUPHINE HCL 10 MG/ML IJ SOLN: 5.0000 mg | INTRAMUSCULAR | Status: DC | PRN

## 2014-08-04 MED ORDER — PHENYLEPHRINE 8 MG IN D5W 100 ML (0.08MG/ML) PREMIX OPTIME
INJECTION | INTRAVENOUS | Status: DC | PRN
  Administered 2014-08-04: 60 ug/min via INTRAVENOUS

## 2014-08-04 MED ORDER — MEPERIDINE HCL 25 MG/ML IJ SOLN: 6.2500 mg | INTRAMUSCULAR | Status: DC | PRN

## 2014-08-04 MED ORDER — VITAMIN K1 1 MG/0.5ML IJ SOLN
INTRAMUSCULAR | Status: AC
Start: 2014-08-04 — End: 2014-08-05
  Filled 2014-08-04: qty 0.5

## 2014-08-04 MED ORDER — FENTANYL CITRATE (PF) 100 MCG/2ML IJ SOLN: 25.0000 ug | INTRAMUSCULAR | Status: DC | PRN

## 2014-08-04 MED ORDER — MORPHINE SULFATE (PF) 0.5 MG/ML IJ SOLN
INTRAMUSCULAR | Status: DC | PRN
  Administered 2014-08-04: .1 mg via INTRATHECAL

## 2014-08-04 MED ORDER — DIPHENHYDRAMINE HCL 25 MG PO CAPS
25.0000 mg | ORAL_CAPSULE | ORAL | Status: DC | PRN
  Filled 2014-08-04: qty 1

## 2014-08-04 MED ORDER — CITRIC ACID-SODIUM CITRATE 334-500 MG/5ML PO SOLN
ORAL | Status: AC
  Filled 2014-08-04: qty 15

## 2014-08-04 MED ORDER — LACTATED RINGERS IV SOLN
INTRAVENOUS | Status: DC
  Administered 2014-08-04 (×2): via INTRAVENOUS

## 2014-08-04 MED ORDER — BUTORPHANOL TARTRATE 1 MG/ML IJ SOLN
1.0000 mg | Freq: Once | INTRAMUSCULAR | Status: AC
  Administered 2014-08-04: 1 mg via INTRAVENOUS

## 2014-08-04 MED ORDER — BUTORPHANOL TARTRATE 1 MG/ML IJ SOLN
INTRAMUSCULAR | Status: AC
  Filled 2014-08-04: qty 1

## 2014-08-04 MED ORDER — CEFAZOLIN SODIUM-DEXTROSE 2-3 GM-% IV SOLR
2.0000 g | INTRAVENOUS | Status: AC
  Administered 2014-08-04: 2 g via INTRAVENOUS
  Filled 2014-08-04: qty 50

## 2014-08-04 MED ORDER — FENTANYL CITRATE (PF) 100 MCG/2ML IJ SOLN
INTRAMUSCULAR | Status: DC | PRN
  Administered 2014-08-04: 25 ug via INTRATHECAL

## 2014-08-04 MED ORDER — NALBUPHINE HCL 10 MG/ML IJ SOLN
5.0000 mg | INTRAMUSCULAR | Status: DC | PRN
Start: 2014-08-04 — End: 2014-08-06

## 2014-08-04 MED ORDER — DIPHENHYDRAMINE HCL 50 MG/ML IJ SOLN: 12.5000 mg | INTRAMUSCULAR | Status: DC | PRN

## 2014-08-04 MED ORDER — ONDANSETRON HCL 4 MG/2ML IJ SOLN
INTRAMUSCULAR | Status: DC | PRN
  Administered 2014-08-04: 4 mg via INTRAVENOUS

## 2014-08-04 SURGICAL SUPPLY — 36 items
BENZOIN TINCTURE PRP APPL 2/3 (GAUZE/BANDAGES/DRESSINGS) ×3 IMPLANT
CLAMP CORD UMBIL (MISCELLANEOUS) IMPLANT
CLOSURE WOUND 1/2 X4 (GAUZE/BANDAGES/DRESSINGS) ×1
CLOTH BEACON ORANGE TIMEOUT ST (SAFETY) ×3 IMPLANT
CONTAINER PREFILL 10% NBF 15ML (MISCELLANEOUS) IMPLANT
DRAPE SHEET LG 3/4 BI-LAMINATE (DRAPES) IMPLANT
DRSG OPSITE POSTOP 4X10 (GAUZE/BANDAGES/DRESSINGS) ×3 IMPLANT
DURAPREP 26ML APPLICATOR (WOUND CARE) ×3 IMPLANT
ELECT REM PT RETURN 9FT ADLT (ELECTROSURGICAL) ×3
ELECTRODE REM PT RTRN 9FT ADLT (ELECTROSURGICAL) ×1 IMPLANT
EXTRACTOR VACUUM M CUP 4 TUBE (SUCTIONS) IMPLANT
EXTRACTOR VACUUM M CUP 4' TUBE (SUCTIONS)
GLOVE BIO SURGEON STRL SZ 6.5 (GLOVE) ×2 IMPLANT
GLOVE BIO SURGEONS STRL SZ 6.5 (GLOVE) ×1
GOWN STRL REUS W/TWL LRG LVL3 (GOWN DISPOSABLE) ×6 IMPLANT
KIT ABG SYR 3ML LUER SLIP (SYRINGE) IMPLANT
NEEDLE HYPO 25X5/8 SAFETYGLIDE (NEEDLE) IMPLANT
NS IRRIG 1000ML POUR BTL (IV SOLUTION) ×3 IMPLANT
PACK C SECTION WH (CUSTOM PROCEDURE TRAY) ×3 IMPLANT
PAD OB MATERNITY 4.3X12.25 (PERSONAL CARE ITEMS) ×3 IMPLANT
RTRCTR C-SECT PINK 25CM LRG (MISCELLANEOUS) ×3 IMPLANT
STRIP CLOSURE SKIN 1/2X4 (GAUZE/BANDAGES/DRESSINGS) ×2 IMPLANT
SUT MNCRL 0 VIOLET CTX 36 (SUTURE) ×2 IMPLANT
SUT MONOCRYL 0 CTX 36 (SUTURE) ×4
SUT PLAIN 1 NONE 54 (SUTURE) IMPLANT
SUT PLAIN 2 0 XLH (SUTURE) ×3 IMPLANT
SUT VIC AB 0 CT1 27 (SUTURE) ×4
SUT VIC AB 0 CT1 27XBRD ANBCTR (SUTURE) ×2 IMPLANT
SUT VIC AB 2-0 CT1 27 (SUTURE) ×2
SUT VIC AB 2-0 CT1 TAPERPNT 27 (SUTURE) ×1 IMPLANT
SUT VIC AB 3-0 SH 27 (SUTURE) ×2
SUT VIC AB 3-0 SH 27X BRD (SUTURE) ×1 IMPLANT
SUT VIC AB 4-0 KS 27 (SUTURE) ×3 IMPLANT
SYR BULB IRRIGATION 50ML (SYRINGE) ×3 IMPLANT
TOWEL OR 17X24 6PK STRL BLUE (TOWEL DISPOSABLE) ×3 IMPLANT
TRAY FOLEY CATH SILVER 14FR (SET/KITS/TRAYS/PACK) ×3 IMPLANT

## 2014-08-04 NOTE — Consult Note (Signed)
Neonatology Note:   Attendance at C-section:    I was asked by Dr. Hinton RaoBovard-Stuckert to attend this repeat C/S at term. The mother is a G3P2 A pos, GBS pos with ulcerative colitis. ROM at delivery, fluid clear. Infant vigorous with good spontaneous cry and tone. Delayed cord clamping was done. Needed no suctioning. Ap 9/9. Lungs clear to ausc in DR. To CN to care of Pediatrician.   Doretha Souhristie C. Robi Dewolfe, MD

## 2014-08-04 NOTE — Anesthesia Procedure Notes (Signed)

## 2014-08-04 NOTE — Progress Notes (Signed)
Patient ID: [name], female   DOB: [birth year], 29 y.o.   MRN: [id number]  Pt uncomfortable, occ ctx, increased pelvic pressure. D/w pt r/b/a of rLTCS, will proceed at 9pm

## 2014-08-04 NOTE — H&P (Signed)
[name] is a 29 y.o. female G3P2002 at 39+6 with pelvic pressure.  H/O LTCS, scheduled for repeat LTCS 7/14, to move up to today.  D/W pt r/b/a of rLTCS, was cl/th/high at office visit today.  Last ate at noon, repeat scheduled at 8pm.  Possible ROM.   [facility]  +FM, no VB, increased pelvic pressure.     Maternal Medical History:  Fetal activity: Perceived fetal activity is normal.    Prenatal Complications - Diabetes: none.    OB History    Gravida Para Term Preterm AB TAB SAB Ectopic Multiple Living   3 2 2       2     No STD, no abn pap G3P2002 G1 - 5#2 - female 552 - 6#14 - female  Past Medical History  Diagnosis Date  . Ulcerative colitis   . Small for dates affecting management of mother 04/16/2011  . Migraines   . Gallstone pancreatitis 06/02/11  . Normal pregnancy in multigravida in third trimester 08/04/2014   Past Surgical History  Procedure Laterality Date  . Cesarean section  2008  . Cesarean section  04/18/2011    Procedure: CESAREAN SECTION;  Surgeon: Sherron MondayJody Bovard, MD;  Location: [facility]S;  Service: Gynecology;  Laterality: N/A;  . Cholecystectomy  06/06/2011    Procedure: LAPAROSCOPIC CHOLECYSTECTOMY WITH INTRAOPERATIVE CHOLANGIOGRAM;  Surgeon: Cherylynn RidgesJames O Wyatt, MD;  Location: [facility] OR;  Service: General;  Laterality: N/A;   Family History: family history is not on file. Social History:  reports that she has never smoked. She has never used smokeless tobacco. She reports that she drinks alcohol. She reports that she does not use illicit drugs. Meds PNV All NKDA  Prenatal Transfer Tool  Maternal Diabetes: No Genetic Screening: Declined Maternal Ultrasounds/Referrals: Normal Fetal Ultrasounds or other Referrals:  None Maternal Substance Abuse:  No Significant Maternal Medications:  None Significant Maternal Lab Results:  Lab values include: Group B Strep positive Other Comments:  None  Review of Systems  Constitutional: Negative.   Eyes: Negative.    Respiratory: Negative.   Cardiovascular: Negative.   Gastrointestinal: Negative.   Genitourinary:       Pelvic pressure  Musculoskeletal: Negative.   Skin: Negative.   Neurological: Negative.   Psychiatric/Behavioral: Negative.       currently breastfeeding. Maternal Exam:  Abdomen: Patient reports no abdominal tenderness. Surgical scars: low transverse.   Fundal height is appropriate for gesation.   Estimated fetal weight is 6-7#.   Fetal presentation: vertex  Introitus: Normal vulva. Normal vagina.  Cervix: Cervix evaluated by digital exam.     Physical Exam  Constitutional: She is oriented to person, place, and time. She appears well-developed and well-nourished.  HENT:  Head: Normocephalic and atraumatic.  Cardiovascular: Normal rate and regular rhythm.   Respiratory: Effort normal and breath sounds normal. No respiratory distress. She has no wheezes.  GI: Soft. Bowel sounds are normal. She exhibits no distension. There is no tenderness.  Musculoskeletal: Normal range of motion.  Neurological: She is alert and oriented to person, place, and time.  Skin: Skin is warm and dry.  Psychiatric: She has a normal mood and affect. Her behavior is normal.    Prenatal labs: ABO, Rh:  A+ Antibody:  neg Rubella:  immune RPR:   NR HBsAg:   neg HIV:   neg GBS:   positive  Hgb 12.6/Plt 189K/Ur Cx neg/GC neg/ Chl neg/ Pap WNL/ glucola 78/CF neg US good CL, nl anat, post plac  Assessment/Plan: 16XW [id number] at 39+6 with pelvic pressure/discomfort For rLTCS at 8pm, last po at noon D/w pt r/b/a of rLTCS   Bovard-Stuckert, [name] 08/04/2014, 4:36 PM

## 2014-08-04 NOTE — Plan of Care (Signed)
Problem: Phase I Progression Outcomes Goal: Other Phase I Outcomes/Goals Pt in L&D for monitoring/evaluation for scheduled c-section at 2000 today.  Pt verbalizes understanding

## 2014-08-04 NOTE — Transfer of Care (Signed)
Immediate Anesthesia Transfer of Care Note  Patient: [name]  Procedure(s) Performed: Procedure(s) with comments: REPEAT CESAREAN SECTION (N/A) - MD requests RNFA Tracie S. confirmed RNFA 07/18/2014  Patient Location: PACU  Anesthesia Type:Spinal  Level of Consciousness: awake, alert  and oriented  Airway & Oxygen Therapy: Patient Spontanous Breathing  Post-op Assessment: Report given to RN and Post -op Vital signs reviewed and stable  Post vital signs: Reviewed and stable  Last Vitals:  Filed Vitals:   08/04/14 2031  BP:   Pulse:   Temp: 36.7 C  Resp: 16    Complications: No apparent anesthesia complications

## 2014-08-04 NOTE — Anesthesia Preprocedure Evaluation (Signed)
Anesthesia Evaluation  Patient identified by MRN, date of birth, ID band Patient awake    Reviewed: Allergy & Precautions, NPO status , Patient's Chart, lab work & pertinent test results  History of Anesthesia Complications Negative for: history of anesthetic complications  Airway Mallampati: II  TM Distance: >3 FB Neck ROM: Full    Dental no notable dental hx. (+) Dental Advisory Given   Pulmonary neg pulmonary ROS,  breath sounds clear to auscultation  Pulmonary exam normal       Cardiovascular negative cardio ROS Normal cardiovascular examRhythm:Regular Rate:Normal     Neuro/Psych  Headaches, negative psych ROS   GI/Hepatic Neg liver ROS, PUD,   Endo/Other  Morbid obesity  Renal/GU negative Renal ROS  negative genitourinary   Musculoskeletal negative musculoskeletal ROS (+)   Abdominal   Peds negative pediatric ROS (+)  Hematology negative hematology ROS (+)   Anesthesia Other Findings   Reproductive/Obstetrics (+) Pregnancy                             Anesthesia Physical Anesthesia Plan  ASA: III  Anesthesia Plan: Spinal   Post-op Pain Management:    Induction:   Airway Management Planned:   Additional Equipment:   Intra-op Plan:   Post-operative Plan:   Informed Consent: I have reviewed the patients History and Physical, chart, labs and discussed the procedure including the risks, benefits and alternatives for the proposed anesthesia with the patient or authorized representative who has indicated his/her understanding and acceptance.   Dental advisory given  Plan Discussed with: CRNA  Anesthesia Plan Comments:         Anesthesia Quick Evaluation

## 2014-08-04 NOTE — Brief Op Note (Signed)
08/04/2014  10:17 PM  PATIENT:  [name]  29 y.o. female  PRE-OPERATIVE DIAGNOSIS:  Repeat C/Section  POST-OPERATIVE DIAGNOSIS:  Repeat C/Section  PROCEDURE:  Procedure(s) with comments: REPEAT CESAREAN SECTION (N/A) - MD   SURGEON:  Surgeon(s) and Role:    * [name] ReinJody Bovard-Stuckert, MD - Primary  FINDINGS: viable female infant at 21:27, apgars 9/9, wt P.  Nl uterus with peritoneal adhesions, nl tubes and ovaries  ANESTHESIA:   spinal  EBL:  Total I/O In: 2000 [I.V.:2000] Out: 700 [Urine:100; Blood:600]  BLOOD ADMINISTERED:none  DRAINS: Urinary Catheter (Foley)   LOCAL MEDICATIONS USED:  NONE  SPECIMEN:  Source of Specimen:  Placenta  DISPOSITION OF SPECIMEN:  L&D  COUNTS:  YES  TOURNIQUET:  * No tourniquets in log *  DICTATION: .Other Dictation: Dictation Number [id number]  PLAN OF CARE: Admit to inpatient   PATIENT DISPOSITION:  PACU - hemodynamically stable.   Delay start of Pharmacological VTE agent (>24hrs) due to surgical blood loss or risk of bleeding: not applicable.  D/W pt r/b/a of circumcision for female infant - wish to proceed.

## 2014-08-05 LAB — CBC
HCT: 30.8 % — ABNORMAL LOW (ref 36.0–46.0)
HEMOGLOBIN: 10.1 g/dL — AB (ref 12.0–15.0)
MCH: 30.9 pg (ref 26.0–34.0)
MCHC: 33.1 g/dL (ref 30.0–36.0)
MCV: 93.3 fL (ref 78.0–100.0)
Platelets: 152 10*3/uL (ref 150–400)
RBC: 3.3 MIL/uL — ABNORMAL LOW (ref 3.87–5.11)
RDW: 13.5 % (ref 11.5–15.5)
WBC: 10.2 10*3/uL (ref 4.0–10.5)

## 2014-08-05 LAB — RPR: RPR: NONREACTIVE

## 2014-08-05 MED ORDER — KETOROLAC TROMETHAMINE 30 MG/ML IJ SOLN
INTRAMUSCULAR | Status: AC
Start: 2014-08-05 — End: 2014-08-05
  Filled 2014-08-05: qty 1

## 2014-08-05 MED ORDER — ACETAMINOPHEN 325 MG PO TABS: 650.0000 mg | ORAL_TABLET | ORAL | Status: DC | PRN

## 2014-08-05 MED ORDER — NALOXONE HCL 0.4 MG/ML IJ SOLN: 0.4000 mg | INTRAMUSCULAR | Status: DC | PRN

## 2014-08-05 MED ORDER — OXYCODONE-ACETAMINOPHEN 5-325 MG PO TABS
1.0000 | ORAL_TABLET | ORAL | Status: DC | PRN
  Administered 2014-08-05 – 2014-08-06 (×2): 1 via ORAL
  Filled 2014-08-05 (×2): qty 1

## 2014-08-05 MED ORDER — LACTATED RINGERS IV SOLN
INTRAVENOUS | Status: DC
  Administered 2014-08-05: 10:00:00 via INTRAVENOUS
  Administered 2014-08-05: 999 mL via INTRAVENOUS

## 2014-08-05 MED ORDER — DIBUCAINE 1 % RE OINT: 1.0000 "application " | TOPICAL_OINTMENT | RECTAL | Status: DC | PRN

## 2014-08-05 MED ORDER — IBUPROFEN 800 MG PO TABS
800.0000 mg | ORAL_TABLET | Freq: Three times a day (TID) | ORAL | Status: DC
  Administered 2014-08-05 – 2014-08-06 (×4): 800 mg via ORAL
  Filled 2014-08-05 (×5): qty 1

## 2014-08-05 MED ORDER — SIMETHICONE 80 MG PO CHEW
80.0000 mg | CHEWABLE_TABLET | Freq: Three times a day (TID) | ORAL | Status: DC
  Administered 2014-08-05 – 2014-08-06 (×5): 80 mg via ORAL
  Filled 2014-08-05 (×5): qty 1

## 2014-08-05 MED ORDER — MENTHOL 3 MG MT LOZG: 1.0000 | LOZENGE | OROMUCOSAL | Status: DC | PRN

## 2014-08-05 MED ORDER — SODIUM CHLORIDE 0.9 % IJ SOLN: 3.0000 mL | INTRAMUSCULAR | Status: DC | PRN

## 2014-08-05 MED ORDER — ZOLPIDEM TARTRATE 5 MG PO TABS: 5.0000 mg | ORAL_TABLET | Freq: Every evening | ORAL | Status: DC | PRN

## 2014-08-05 MED ORDER — NALOXONE HCL 1 MG/ML IJ SOLN
1.0000 ug/kg/h | INTRAVENOUS | Status: DC | PRN
  Filled 2014-08-05: qty 2

## 2014-08-05 MED ORDER — WITCH HAZEL-GLYCERIN EX PADS: 1.0000 "application " | MEDICATED_PAD | CUTANEOUS | Status: DC | PRN

## 2014-08-05 MED ORDER — SCOPOLAMINE 1 MG/3DAYS TD PT72
1.0000 | MEDICATED_PATCH | Freq: Once | TRANSDERMAL | Status: DC
  Filled 2014-08-05: qty 1

## 2014-08-05 MED ORDER — PRENATAL MULTIVITAMIN CH
1.0000 | ORAL_TABLET | Freq: Every day | ORAL | Status: DC
  Administered 2014-08-05 – 2014-08-06 (×2): 1 via ORAL
  Filled 2014-08-05 (×2): qty 1

## 2014-08-05 MED ORDER — SIMETHICONE 80 MG PO CHEW: 80.0000 mg | CHEWABLE_TABLET | ORAL | Status: DC | PRN

## 2014-08-05 MED ORDER — DIPHENHYDRAMINE HCL 25 MG PO CAPS: 25.0000 mg | ORAL_CAPSULE | Freq: Four times a day (QID) | ORAL | Status: DC | PRN

## 2014-08-05 MED ORDER — SIMETHICONE 80 MG PO CHEW
80.0000 mg | CHEWABLE_TABLET | ORAL | Status: DC
  Administered 2014-08-05: 80 mg via ORAL
  Filled 2014-08-05: qty 1

## 2014-08-05 MED ORDER — ONDANSETRON HCL 4 MG/2ML IJ SOLN: 4.0000 mg | Freq: Three times a day (TID) | INTRAMUSCULAR | Status: DC | PRN

## 2014-08-05 MED ORDER — LANOLIN HYDROUS EX OINT: 1.0000 "application " | TOPICAL_OINTMENT | CUTANEOUS | Status: DC | PRN

## 2014-08-05 MED ORDER — OXYTOCIN 40 UNITS IN LACTATED RINGERS INFUSION - SIMPLE MED: 62.5000 mL/h | INTRAVENOUS | Status: AC

## 2014-08-05 MED ORDER — SENNOSIDES-DOCUSATE SODIUM 8.6-50 MG PO TABS
2.0000 | ORAL_TABLET | ORAL | Status: DC
  Administered 2014-08-05: 2 via ORAL
  Filled 2014-08-05: qty 2

## 2014-08-05 MED ORDER — OXYCODONE-ACETAMINOPHEN 5-325 MG PO TABS
2.0000 | ORAL_TABLET | ORAL | Status: DC | PRN
Start: 2014-08-05 — End: 2014-08-06
  Administered 2014-08-05: 2 via ORAL
  Filled 2014-08-05: qty 2

## 2014-08-05 NOTE — Progress Notes (Signed)
Subjective: Postpartum Day 1: Cesarean Delivery Patient reports incisional pain and tolerating PO.  Pain controlled, nl lochia  Objective: Vital signs in last 24 hours: Temp:  [97.5 F (36.4 C)-98.9 F (37.2 C)] 98.3 F (36.8 C) (07/12 0408) Pulse Rate:  [66-95] 66 (07/12 0240) Resp:  [16-22] 18 (07/12 0408) BP: (99-144)/(53-98) 101/53 mmHg (07/12 0240) SpO2:  [95 %-98 %] 97 % (07/12 0408) Weight:  [102.967 kg (227 lb)] 102.967 kg (227 lb) (07/11 1700)  Physical Exam:  General: alert and no distress Lochia: appropriate Uterine Fundus: firm Incision: healing well DVT Evaluation: No evidence of DVT seen on physical exam.   Recent Labs  08/04/14 1710  HGB 12.9  HCT 37.9    Assessment/Plan: Status post Cesarean section. Doing well postoperatively.  Continue current care.  Bovard-Stuckert, Derreck Wiltsey 08/05/2014, 6:13 AM

## 2014-08-05 NOTE — Op Note (Signed)
[name]:  [name], [name]                  ACCOUNT NO.:  [id number]  MEDICAL RECORD NO.:  [id number]  LOCATION:  9103                          FACILITY:  [facility]  PHYSICIAN:  Sherron MondayJody Bovard, MD        DATE OF BIRTH:  [birth year]  DATE OF PROCEDURE:  08/04/2014 DATE OF DISCHARGE:                              OPERATIVE REPORT   PREOPERATIVE DIAGNOSES:  Intrauterine pregnancy at term, history of low- transverse cesarean section x2.  POSTOPERATIVE DIAGNOSES:  Intrauterine pregnancy at term, history of low- transverse cesarean section x2, delivered.  PROCEDURE:  Repeat low-transverse cesarean section.  SURGEON:  Sherron MondayJody Bovard, MD  ASSISTANT:  No assistant.  FINDINGS:  Viable female infant at 21:27 with Apgars of 9 at one minute and 9 at five minutes.  Weight pending at the time of dictation.  Normal uterus.  No peritoneal adhesions.  Normal tubes and ovaries.  ANESTHESIA:  Spinal.  IV FLUIDS:  2000.  URINE OUTPUT:  100 mL of clear urine at the end of the procedure.  EBL:  Approximately 600 mL.  COMPLICATIONS:  None.  PATHOLOGY:  Placenta to L and D.  DESCRIPTION OF PROCEDURE:  After informed consent was reviewed with the patient including risks, benefits, and alternatives of surgical procedure, she was transported to the OR, where spinal anesthesia was placed and found to be adequate.  She was then returned to the supine position with a leftward tilt.  Prepped and draped in a normal sterile fashion.  A Foley catheter was sterilely placed in her bladder.  After awaiting an appropriate time-out, a Pfannenstiel incision was made at the level of her previous incision, carried through the underlying layer of fascia sharply.  The fascia was incised in the midline, the incision was extended laterally with Mayo scissors.  The superior aspect of the fascial incision was grasped with Kocher clamps, elevated, and the rectus muscles were dissected off both bluntly and sharply.  Attention was then  turned to identifying the midline.  The peritoneum was identified, tented up with pickups, and the peritoneum was entered sharply.  The incision was extended superiorly and inferiorly with good visualization of the bladder.  At this time, peritoneal uterine adhesions were noted and sharply taken down.  The Alexis skin retractor was placed carefully making sure no bowel was entrapped.  The uterus was incised in a transverse fashion.  Infant was delivered from a vertex presentation.  Nose and mouth were suctioned on the field.  After an appropriate 1-minute cord clamping delay, cord was clamped and cut. Infant was handed off to the awaiting pediatric staff.  The placenta was expressed.  Uterus was cleared of all clots and debris.  The uterine incision was closed with 2 layers of 0-Monocryl, first of which was running locked and the second as an imbricating layer.  Additional sutures of Monocryl as well as 3-0 Vicryl were used to make the uterus hemostatic.  The gutters were cleared of all clot and debris.  The anatomy was inspected carefully.  The Alexis skin retractor was removed. The peritoneum was reapproximated using 2-0 Vicryl in a running fashion. The subfascial planes were inspected, found to be  hemostatic.  The fascia was then reapproximated using 0-Vicryl in a running fashion.  The subcuticular plane and adipose tissues were found to be hemostatic.  The dead space was closed with 3-0 plain gut.  Skin was closed with 4-0 Vicryl on a Keith needle.  Benzoin and Steri-Strips were applied. Sponge, lap, and needle counts were correct x2 per the operating staff.     Sherron Monday, MD     JB/MEDQ  D:  08/04/2014  T:  08/05/2014  Job:  [id number]

## 2014-08-05 NOTE — Anesthesia Postprocedure Evaluation (Signed)
Anesthesia Post Note  Patient: [name]  Procedure(s) Performed: Procedure(s) (LRB): REPEAT CESAREAN SECTION (N/A)  Anesthesia type: Epidural  Patient location: Mother/Baby  Post pain: Pain level controlled  Post assessment: Post-op Vital signs reviewed  Last Vitals:  Filed Vitals:   08/05/14 0408  BP:   Pulse:   Temp: 36.8 C  Resp: 18    Post vital signs: Reviewed  Level of consciousness:alert  Complications: No apparent anesthesia complications

## 2014-08-05 NOTE — Addendum Note (Signed)
Addendum  created 08/05/14 0753 by Algis GreenhouseLinda A Areon Cocuzza, CRNA   Modules edited: Charges VN, Notes Section   Notes Section:  File: [id number]

## 2014-08-05 NOTE — Lactation Note (Signed)
This note was copied from the chart of Boy Dilia Ahola. Lactation Consultation Note  P3, Breastfed 1st child for 3 weeks and 2nd child for 7 weeks until she had her gallbladder removed. States she knows how to hand express. Denies problems or questions with breastfeeding.  She states this child is breastfeeding the best out of all 3 of her children. Encouraged STS.  Mother exhausted.  Baby sleeping and mother wants to rest now. Reviewed cluster feeding. Mom encouraged to feed baby 8-12 times/24 hours and with feeding cues.  Mom made aware of O/P services, breastfeeding support groups, community resources, and our phone # for post-discharge questions.    Patient Name: Vernell LeepBoy Richetta Teasdale Today's Date: 08/05/2014 Reason for consult: Initial assessment   Maternal Data Has patient been taught Hand Expression?: Yes Does the patient have breastfeeding experience prior to this delivery?: Yes  Feeding Feeding Type: Breast Fed Length of feed: 10 min  LATCH Score/Interventions                      Lactation Tools Discussed/Used     Consult Status Consult Status: Follow-up Date: 08/06/14 Follow-up type: In-patient    Dahlia ByesBerkelhammer, [facility] 08/05/2014, 2:20 PM

## 2014-08-05 NOTE — Anesthesia Postprocedure Evaluation (Signed)
  Anesthesia Post-op Note  Patient: [name]  Procedure(s) Performed: Procedure(s) with comments: REPEAT CESAREAN SECTION (N/A) - MD requests RNFA Tracie S. confirmed RNFA 07/18/2014  Patient is awake, responsive, moving her legs, and has signs of resolution of her numbness. Pain and nausea are reasonably well controlled. Vital signs are stable and clinically acceptable. Oxygen saturation is clinically acceptable. There are no apparent anesthetic complications at this time. Patient is ready for discharge.

## 2014-08-06 ENCOUNTER — Encounter (HOSPITAL_COMMUNITY): Payer: Self-pay | Admitting: Obstetrics and Gynecology

## 2014-08-06 ENCOUNTER — Inpatient Hospital Stay (HOSPITAL_COMMUNITY): Admission: RE | Admit: 2014-08-06 | Payer: Medicaid Other | Source: Ambulatory Visit

## 2014-08-06 LAB — BIRTH TISSUE RECOVERY COLLECTION (PLACENTA DONATION)

## 2014-08-06 MED ORDER — PRENATAL MULTIVITAMIN CH: 1.0000 | ORAL_TABLET | Freq: Every day | ORAL | Status: AC

## 2014-08-06 MED ORDER — OXYCODONE-ACETAMINOPHEN 5-325 MG PO TABS: 1.0000 | ORAL_TABLET | Freq: Four times a day (QID) | ORAL | Status: DC | PRN

## 2014-08-06 MED ORDER — IBUPROFEN 800 MG PO TABS: 800.0000 mg | ORAL_TABLET | Freq: Three times a day (TID) | ORAL | Status: DC

## 2014-08-06 NOTE — Progress Notes (Addendum)
Subjective: Postpartum Day 2: Cesarean Delivery Patient reports incisional pain and tolerating PO.    Objective: Vital signs in last 24 hours: Temp:  [97.9 F (36.6 C)-98.4 F (36.9 C)] 98.4 F (36.9 C) (07/13 40980614) Pulse Rate:  [66-71] 67 (07/13 0614) Resp:  [16-18] 18 (07/13 0614) BP: (103-123)/(51-74) 120/74 mmHg (07/13 0614) SpO2:  [98 %] 98 % (07/13 11910614)  Physical Exam:  General: alert and no distress Lochia: appropriate Uterine Fundus: firm Incision: healing well DVT Evaluation: No evidence of DVT seen on physical exam.   Recent Labs  08/04/14 1710 08/05/14 0614  HGB 12.9 10.1*  HCT 37.9 30.8*    Assessment/Plan: Status post Cesarean section. Doing well postoperatively.  Continue current care.  Desires d/c to home - will d/c with motrin, percocet and PNV.  F/u 2 wk for incision check  Bovard-Stuckert, Aaralynn Shepheard 08/06/2014, 7:42 AM

## 2014-08-06 NOTE — Discharge Summary (Signed)
Obstetric Discharge Summary Reason for Admission: onset of labor and cesarean section Prenatal Procedures: none Intrapartum Procedures: cesarean: low cervical, transverse Postpartum Procedures: none Complications-Operative and Postpartum: none HEMOGLOBIN  Date Value Ref Range Status  08/05/2014 10.1* 12.0 - 15.0 g/dL Final    Comment:    DELTA CHECK NOTED REPEATED TO VERIFY    HCT  Date Value Ref Range Status  08/05/2014 30.8* 36.0 - 46.0 % Final    Physical Exam:  General: alert and no distress Lochia: appropriate Uterine Fundus: firm Incision: healing well DVT Evaluation: No evidence of DVT seen on physical exam.  Discharge Diagnoses: Term Pregnancy-delivered  Discharge Information: Date: 08/06/2014 Activity: pelvic rest Diet: routine Medications: PNV, Ibuprofen and Percocet Condition: stable Instructions: refer to practice specific booklet Discharge to: home Follow-up Information    Follow up with Bovard-Stuckert, Rosabell Geyer, MD. Schedule an appointment as soon as possible for a visit in 2 weeks.   Specialty:  Obstetrics and Gynecology   Why:  for incision check, 6 weeks for full postpartum check   Contact information:   [address] [place] [id number] [phone]       Newborn Data: Live born female  Birth Weight: 7 lb 15.5 oz (3615 g) APGAR: 9, 9  Home with mother.  Bovard-Stuckert, Annarae Macnair 08/06/2014, 8:04 AM

## 2016-03-01 LAB — OB RESULTS CONSOLE HEPATITIS B SURFACE ANTIGEN: HEP B S AG: NEGATIVE

## 2016-03-01 LAB — OB RESULTS CONSOLE RUBELLA ANTIBODY, IGM: RUBELLA: IMMUNE

## 2016-06-23 ENCOUNTER — Inpatient Hospital Stay (HOSPITAL_COMMUNITY)
Admission: AD | Admit: 2016-06-23 | Discharge: 2016-06-23 | Disposition: A | Discharge status: Home / Self Care | Payer: BLUE CROSS/BLUE SHIELD | Source: Ambulatory Visit | Attending: Obstetrics and Gynecology | Admitting: Obstetrics and Gynecology

## 2016-06-23 ENCOUNTER — Encounter (HOSPITAL_COMMUNITY): Payer: Self-pay

## 2016-06-23 DIAGNOSIS — O4692 Antepartum hemorrhage, unspecified, second trimester: Secondary | ICD-10-CM | POA: Insufficient documentation

## 2016-06-23 DIAGNOSIS — Z3A24 24 weeks gestation of pregnancy: Secondary | ICD-10-CM | POA: Diagnosis not present

## 2016-06-23 LAB — URINALYSIS, ROUTINE W REFLEX MICROSCOPIC
BILIRUBIN URINE: NEGATIVE
Glucose, UA: NEGATIVE mg/dL
Hgb urine dipstick: NEGATIVE
Ketones, ur: NEGATIVE mg/dL
Leukocytes, UA: NEGATIVE
NITRITE: NEGATIVE
PH: 6 (ref 5.0–8.0)
Protein, ur: NEGATIVE mg/dL
Specific Gravity, Urine: 1.01 (ref 1.005–1.030)

## 2016-06-23 NOTE — Discharge Instructions (Signed)
Pelvic Rest °Pelvic rest Spinello be recommended if: °· Your placenta is partially or completely covering the opening of your cervix (placenta previa). °· There is bleeding between the wall of the uterus and the amniotic sac in the first trimester of pregnancy (subchorionic hemorrhage). °· You went into labor too early (preterm labor). ° °Based on your overall health and the health of your baby, your health care provider will decide if pelvic rest is right for you. °How do I rest my pelvis? °For as long as told by your health care provider: °· Do not have sex, sexual stimulation, or an orgasm. °· Do not use tampons. Do not douche. Do not put anything in your vagina. °· Do not lift anything that is heavier than 10 lb (4.5 kg). °· Avoid activities that take a lot of effort (are strenuous). °· Avoid any activity in which your pelvic muscles could become strained. ° °When should I seek medical care? °Seek medical care if you have: °· Cramping pain in your lower abdomen. °· Vaginal discharge. °· A low, dull backache. °· Regular contractions. °· Uterine tightening. ° °When should I seek immediate medical care? °Seek immediate medical care if: °· You have vaginal bleeding and you are pregnant. ° °This information is not intended to replace advice given to you by your health care provider. Make sure you discuss any questions you have with your health care provider. °Document Released: 05/07/2010 Document Revised: 06/18/2015 Document Reviewed: 07/14/2014 °Elsevier Interactive Patient Education © 2018 [employer] ° °

## 2016-06-23 NOTE — MAU Note (Signed)
Pt states she went to the bathroom today around 345pm and had a gush of bright red blood. Pt states she also noticed some mucous in the toilet. Pt reports no further bleeding since that time. Pt states baby is moving normally. Pt states she feels occasional tightening but denies contractions.

## 2016-06-23 NOTE — MAU Provider Note (Signed)
History     CSN: [id number]  Arrival date and time: 06/23/16 1716   First Provider Initiated Contact with Patient 06/23/16 1808      Chief Complaint  Patient presents with  . Vaginal Bleeding   HPI  Ms. [name] is a 31 y.o. [phone] at [redacted]w[redacted]d who presents to MAU today with complaint of a gush of vaginal bleeding at 1545 today. She denies continued bleeding since that episode. She also denies pain, contractions, LOF or complications with this pregnancy. She reports normal fetal movement.   OB History    Gravida Para Term Preterm AB Living   4 3 3     3    SAB TAB Ectopic Multiple Live Births         0 3      Past Medical History:  Diagnosis Date  . Gallstone pancreatitis 06/02/11  . H/O carpal tunnel syndrome   . HPV (human papilloma virus) infection   . Hx of cholecystectomy 2013  . Migraines   . Normal pregnancy in multigravida in third trimester 08/04/2014  . S/P cesarean section 08/04/2014  . Small for dates affecting management of mother 04/16/2011  . Ulcerative colitis   . Ulcerative colitis [facility])     Past Surgical History:  Procedure Laterality Date  . CESAREAN SECTION  2008  . CESAREAN SECTION  04/18/2011   Procedure: CESAREAN SECTION;  Surgeon: Sherron Monday, MD;  Location: [facility] OR[facility];  Service: Gynecology;  Laterality: N/A;  . CESAREAN SECTION N/A 08/04/2014   Procedure: REPEAT CESAREAN SECTION;  Surgeon: Sherian Rein, MD;  Location: [facility] ORS;  Service: Obstetrics;  Laterality: N/A;  MD requests RNFA Tracie S. confirmed RNFA 07/18/2014  . CHOLECYSTECTOMY  06/06/2011   Procedure: LAPAROSCOPIC CHOLECYSTECTOMY WITH INTRAOPERATIVE CHOLANGIOGRAM;  Surgeon: Cherylynn Ridges, MD;  Location: [facility] OR;  Service: General;  Laterality: N/A;    Family History  Problem Relation Age of Onset  . COPD Mother   . Diabetes Father   . Heart disease Father     Social History  Substance Use Topics  . Smoking status: Never Smoker  . Smokeless tobacco: Never Used  . Alcohol use Yes      Comment: 06/02/11 "last drink 06/2010"    Allergies: No Known Allergies  No prescriptions prior to admission.    Review of Systems  Constitutional: Negative for fever.  Gastrointestinal: Negative for abdominal pain.  Genitourinary: Positive for vaginal bleeding. Negative for vaginal discharge.   Physical Exam   Blood pressure 123/68, pulse 86, temperature 98.6 F (37 C), temperature source Oral, resp. rate 18, height 5\' 3"  (1.6 m), weight 226 lb (102.5 kg), last menstrual period 01/06/2016, SpO2 96 %, unknown if currently breastfeeding.  Physical Exam  Nursing note and vitals reviewed. Constitutional: She is oriented to person, place, and time. She appears well-developed and well-nourished. No distress.  HENT:  Head: Normocephalic and atraumatic.  Cardiovascular: Normal rate.   Respiratory: Effort normal.  GI: Soft. She exhibits no distension and no mass. There is no tenderness. There is no rebound and no guarding.  Genitourinary: Uterus is enlarged (gravid). Uterus is not tender. Cervix exhibits no motion tenderness, no discharge and no friability. No bleeding in the vagina. Vaginal discharge (small, thin, white) found.  Neurological: She is alert and oriented to person, place, and time.  Skin: Skin is warm and dry. No erythema.  Psychiatric: She has a normal mood and affect.  Dilation: Closed Effacement (%): Thick Cervical Position: Posterior Exam by:: Audrina Marten  Alantis Bethune,pa   Results for orders placed or performed during the hospital encounter of 06/23/16 (from the past 24 hour(s))  Urinalysis, Routine w reflex microscopic     Status: None   Collection Time: 06/23/16  3:30 PM  Result Value Ref Range   Color, Urine YELLOW YELLOW   APPearance CLEAR CLEAR   Specific Gravity, Urine 1.010 1.005 - 1.030   pH 6.0 5.0 - 8.0   Glucose, UA NEGATIVE NEGATIVE mg/dL   Hgb urine dipstick NEGATIVE NEGATIVE   Bilirubin Urine NEGATIVE NEGATIVE   Ketones, ur NEGATIVE NEGATIVE mg/dL    Protein, ur NEGATIVE NEGATIVE mg/dL   Nitrite NEGATIVE NEGATIVE   Leukocytes, UA NEGATIVE NEGATIVE    Fetal Monitoring: Baseline: 145 bpm Variability: moderate Accelerations: 10 x 10 Decelerations: none Contractions: none  MAU Course  Procedures None  MDM Discussed patient with Dr. Jackelyn KnifeMeisinger. Ok for discharge at this time with pelvic rest. Follow-up as scheduled in the office or sooner if additional epsiode of bleeding.   Assessment and Plan  A: SIUP at 945w1d Vaginal bleeding in pregnancy, second trimester   P: Discharge home Bleeding and preterm labor precautions discussed Patient advised [facility] as scheduled or sooner if symptoms should change or worsen Patient Wyszynski return to MAU as needed or if her condition were to change or worsen   Vonzella NippleJulie Jefferson Fullam, PA-C 06/23/2016, 7:19 PM

## 2016-09-09 LAB — OB RESULTS CONSOLE GBS: GBS: POSITIVE

## 2016-09-20 ENCOUNTER — Telehealth (HOSPITAL_COMMUNITY): Payer: Self-pay | Admitting: *Deleted

## 2016-09-20 NOTE — Telephone Encounter (Signed)
Preadmission screen  

## 2016-09-21 ENCOUNTER — Telehealth (HOSPITAL_COMMUNITY): Payer: Self-pay | Admitting: *Deleted

## 2016-09-21 NOTE — Telephone Encounter (Signed)
Preadmission screen  

## 2016-09-22 ENCOUNTER — Encounter (HOSPITAL_COMMUNITY): Payer: Self-pay

## 2016-10-03 ENCOUNTER — Encounter (HOSPITAL_COMMUNITY): Payer: Self-pay | Admitting: Obstetrics and Gynecology

## 2016-10-03 DIAGNOSIS — Z3483 Encounter for supervision of other normal pregnancy, third trimester: Secondary | ICD-10-CM

## 2016-10-03 NOTE — H&P (Signed)
[name] is a 31 y.o. female [phone] at 39wk for rLTCS,maybe BTL by B salpingectomy.  Relatively uncomplicated PNC, TOC early in pregnancy.  Tdap declined.Pregnancy dated by LMP cw US.  D/W pt r/b/a of rLTCS and possBTL by B salpingectomy.  Pt states dependent on scar tissue OB History    Gravida Para Term Preterm AB Living   4 3 3     3    SAB TAB Ectopic Multiple Live Births         0 3    G1-G3 5#2-7#15 LTCS female, female, female G4 present  + abn pap, last WNL No STD  Past Medical History:  Diagnosis Date  . Gallstone pancreatitis 06/02/11  . H/O carpal tunnel syndrome   . HPV (human papilloma virus) infection   . Hx of cholecystectomy 2013  . Migraines   . Normal pregnancy in multigravida in third trimester 08/04/2014  . Normal pregnancy in multigravida in third trimester 10/03/2016  . S/P cesarean section 08/04/2014  . Small for dates affecting management of mother 04/16/2011  . Ulcerative colitis   . Ulcerative colitis [facility])    Past Surgical History:  Procedure Laterality Date  . CESAREAN SECTION  2008  . CESAREAN SECTION  04/18/2011   Procedure: CESAREAN SECTION;  Surgeon: Sherron MondayJody Bovard, MD;  Location: [facility];  Service: Gynecology;  Laterality: N/A;  . CESAREAN SECTION N/A 08/04/2014   Procedure: REPEAT CESAREAN SECTION;  Surgeon: Sherian ReinJody Bovard-Stuckert, MD;  Location: [facility];  Service: Obstetrics;  Laterality: N/A;  MD requests RNFA Tracie S. confirmed RNFA 07/18/2014  . CHOLECYSTECTOMY  06/06/2011   Procedure: LAPAROSCOPIC CHOLECYSTECTOMY WITH INTRAOPERATIVE CHOLANGIOGRAM;  Surgeon: Cherylynn RidgesJames O Wyatt, MD;  Location: [facility] OR;  Service: General;  Laterality: N/A;   Family History: family history includes COPD in her mother; Diabetes in her father; Heart disease in her father; Heart failure in her father. Social History:  reports that she has never smoked. She has never used smokeless tobacco. She reports that she drinks alcohol. She reports that she does not use drugs. married.  Personal  banker  Meds PNV All NKDA     Maternal Diabetes: No Genetic Screening: Declined Maternal Ultrasounds/Referrals: Normal Fetal Ultrasounds or other Referrals:  None Maternal Substance Abuse:  No Significant Maternal Medications:  None Significant Maternal Lab Results:  Lab values include: Group B Strep positive Other Comments:  None  Review of Systems  Constitutional: Negative.   HENT: Negative.   Eyes: Negative.   Respiratory: Negative.   Cardiovascular: Negative.   Gastrointestinal: Positive for abdominal pain.  Genitourinary: Negative.   Musculoskeletal: Positive for back pain.  Skin: Negative.   Neurological: Negative.   Psychiatric/Behavioral: Negative.    Maternal Medical History:  Contractions: Frequency: irregular.    Fetal activity: Perceived fetal activity is normal.    Prenatal Complications - Diabetes: none.      Last menstrual period 01/06/2016, unknown if currently breastfeeding. Maternal Exam:  Uterine Assessment: Contraction frequency is irregular.   Abdomen: Surgical scars: low transverse.   Fundal height is appropriate for gestation.   Estimated fetal weight is 7.5-8.5#.   Fetal presentation: vertex  Introitus: Normal vulva. Normal vagina.    Physical Exam  Constitutional: She is oriented to person, place, and time. She appears well-developed and well-nourished.  HENT:  Head: Normocephalic and atraumatic.  Cardiovascular: Normal rate and regular rhythm.   Respiratory: Effort normal and breath sounds normal. No respiratory distress. She has no wheezes.  GI: Soft. Bowel sounds are normal.  She exhibits no distension. There is no tenderness.  Musculoskeletal: Normal range of motion.  Neurological: She is alert and oriented to person, place, and time.  Skin: Skin is warm and dry.  Psychiatric: She has a normal mood and affect. Her behavior is normal.    Prenatal labs: ABO, Rh:  A+ Antibody:  neg Rubella:  immune RPR:   NR HBsAg:    neg HIV:   neg GBS: Positive (08/17 0000)   Declined Tdap and genetic screening/Ur Cx neg/TSH 1.16/Hgb A1C = 4.8  [place] nl anat, post plac, surprise gender  Assessment/Plan: 31yo [id number] at 39wk forrLTCS, poss BTL Ancef for prophylaxis D/w pt r/b/a of procedure    [name] 10/03/2016, 9:26 PM

## 2016-10-04 ENCOUNTER — Encounter (HOSPITAL_COMMUNITY)
Admission: RE | Admit: 2016-10-04 | Discharge: 2016-10-04 | Disposition: A | Discharge status: Home / Self Care | Payer: BLUE CROSS/BLUE SHIELD | Source: Ambulatory Visit | Attending: Obstetrics and Gynecology | Admitting: Obstetrics and Gynecology

## 2016-10-04 LAB — TYPE AND SCREEN
ABO/RH(D): A POS
Antibody Screen: NEGATIVE

## 2016-10-04 LAB — CBC
HEMATOCRIT: 37 % (ref 36.0–46.0)
HEMOGLOBIN: 12.5 g/dL (ref 12.0–15.0)
MCH: 31.2 pg (ref 26.0–34.0)
MCHC: 33.8 g/dL (ref 30.0–36.0)
MCV: 92.3 fL (ref 78.0–100.0)
Platelets: 215 10*3/uL (ref 150–400)
RBC: 4.01 MIL/uL (ref 3.87–5.11)
RDW: 13.4 % (ref 11.5–15.5)
WBC: 11.1 10*3/uL — ABNORMAL HIGH (ref 4.0–10.5)

## 2016-10-04 LAB — COMPREHENSIVE METABOLIC PANEL
ALK PHOS: 123 U/L (ref 38–126)
ALT: 39 U/L (ref 14–54)
ANION GAP: 9 (ref 5–15)
AST: 35 U/L (ref 15–41)
Albumin: 3.2 g/dL — ABNORMAL LOW (ref 3.5–5.0)
BUN: 6 mg/dL (ref 6–20)
CO2: 21 mmol/L — ABNORMAL LOW (ref 22–32)
Calcium: 9 mg/dL (ref 8.9–10.3)
Chloride: 106 mmol/L (ref 101–111)
Creatinine, Ser: 0.64 mg/dL (ref 0.44–1.00)
GLUCOSE: 99 mg/dL (ref 65–99)
Potassium: 3.7 mmol/L (ref 3.5–5.1)
Sodium: 136 mmol/L (ref 135–145)
TOTAL PROTEIN: 7 g/dL (ref 6.5–8.1)
Total Bilirubin: 0.4 mg/dL (ref 0.3–1.2)

## 2016-10-04 NOTE — Patient Instructions (Signed)
20 Reece L Maret  10/04/2016   Your procedure is scheduled on:  10/05/2016  [facility] at 0645 AM.  Pick up the phone at the desk and dial [phone].    Call this number if you have problems the morning of surgery: [phone]   Remember:   Do not eat food:After Midnight.  Do not drink clear liquids: After Midnight.  Take these medicines the morning of surgery with A SIP OF WATER: none   Do not wear jewelry, make-up or nail polish.  Do not wear lotions, powders, or perfumes. Do not wear deodorant.  Do not shave 48 hours prior to surgery.  Do not bring valuables to the hospital.  [facility] is not   responsible for any belongings or valuables brought to the hospital.  Contacts, dentures or bridgework [place] not be worn into surgery.  Leave suitcase in the car. After surgery it [place] be brought to your room.  For patients admitted to the hospital, checkout time is 11:00 AM the day of              discharge.   Patients discharged the day of surgery will not be allowed to drive             home.  Name and phone number of your driver: na   Special Instructions:   N/A   Please read over the following fact sheets that you were given:   Surgical Site Infection Prevention

## 2016-10-05 ENCOUNTER — Inpatient Hospital Stay (HOSPITAL_COMMUNITY): Payer: BLUE CROSS/BLUE SHIELD | Admitting: Anesthesiology

## 2016-10-05 ENCOUNTER — Inpatient Hospital Stay (HOSPITAL_COMMUNITY)
Admission: RE | Admit: 2016-10-05 | Discharge: 2016-10-07 | DRG: 766 | Disposition: A | Discharge status: Home / Self Care | Payer: BLUE CROSS/BLUE SHIELD | Source: Ambulatory Visit | Attending: Obstetrics and Gynecology | Admitting: Obstetrics and Gynecology

## 2016-10-05 ENCOUNTER — Encounter (HOSPITAL_COMMUNITY)
Admission: RE | Disposition: A | Discharge status: Home / Self Care | Payer: Self-pay | Source: Ambulatory Visit | Attending: Obstetrics and Gynecology

## 2016-10-05 ENCOUNTER — Encounter (HOSPITAL_COMMUNITY): Payer: Self-pay | Admitting: *Deleted

## 2016-10-05 DIAGNOSIS — Z98891 History of uterine scar from previous surgery: Secondary | ICD-10-CM

## 2016-10-05 DIAGNOSIS — O99824 Streptococcus B carrier state complicating childbirth: Secondary | ICD-10-CM | POA: Diagnosis present

## 2016-10-05 DIAGNOSIS — O34211 Maternal care for low transverse scar from previous cesarean delivery: Secondary | ICD-10-CM | POA: Diagnosis present

## 2016-10-05 DIAGNOSIS — Z3A39 39 weeks gestation of pregnancy: Secondary | ICD-10-CM | POA: Diagnosis not present

## 2016-10-05 DIAGNOSIS — Z302 Encounter for sterilization: Secondary | ICD-10-CM

## 2016-10-05 DIAGNOSIS — Z9851 Tubal ligation status: Secondary | ICD-10-CM

## 2016-10-05 DIAGNOSIS — Z3483 Encounter for supervision of other normal pregnancy, third trimester: Secondary | ICD-10-CM

## 2016-10-05 HISTORY — DX: Tubal ligation status: Z98.51

## 2016-10-05 LAB — RPR: RPR: NONREACTIVE

## 2016-10-05 SURGERY — Surgical Case
Anesthesia: Spinal | Laterality: Bilateral

## 2016-10-05 MED ORDER — FENTANYL CITRATE (PF) 100 MCG/2ML IJ SOLN
INTRAMUSCULAR | Status: DC | PRN
  Administered 2016-10-05: 10 ug via INTRATHECAL

## 2016-10-05 MED ORDER — NALBUPHINE HCL 10 MG/ML IJ SOLN: 5.0000 mg | Freq: Once | INTRAMUSCULAR | Status: DC | PRN

## 2016-10-05 MED ORDER — BUPIVACAINE IN DEXTROSE 0.75-8.25 % IT SOLN
INTRATHECAL | Status: AC
  Filled 2016-10-05: qty 2

## 2016-10-05 MED ORDER — MORPHINE SULFATE (PF) 0.5 MG/ML IJ SOLN
INTRAMUSCULAR | Status: DC | PRN
  Administered 2016-10-05: .2 mg via INTRATHECAL

## 2016-10-05 MED ORDER — NALOXONE HCL 0.4 MG/ML IJ SOLN: 0.4000 mg | INTRAMUSCULAR | Status: DC | PRN

## 2016-10-05 MED ORDER — BUPIVACAINE IN DEXTROSE 0.75-8.25 % IT SOLN
INTRATHECAL | Status: DC | PRN
  Administered 2016-10-05: 1.4 mL via INTRATHECAL

## 2016-10-05 MED ORDER — PHENYLEPHRINE HCL 10 MG/ML IJ SOLN
INTRAMUSCULAR | Status: DC | PRN
  Administered 2016-10-05: 40 ug via INTRAVENOUS

## 2016-10-05 MED ORDER — PHENYLEPHRINE 8 MG IN D5W 100 ML (0.08MG/ML) PREMIX OPTIME
INJECTION | INTRAVENOUS | Status: DC | PRN
  Administered 2016-10-05: 60 ug/min via INTRAVENOUS

## 2016-10-05 MED ORDER — KETOROLAC TROMETHAMINE 30 MG/ML IJ SOLN
INTRAMUSCULAR | Status: AC
  Filled 2016-10-05: qty 1

## 2016-10-05 MED ORDER — OXYCODONE HCL 5 MG PO TABS: 10.0000 mg | ORAL_TABLET | ORAL | Status: DC | PRN

## 2016-10-05 MED ORDER — METOCLOPRAMIDE HCL 5 MG/ML IJ SOLN
INTRAMUSCULAR | Status: DC | PRN
  Administered 2016-10-05: 10 mg via INTRAVENOUS

## 2016-10-05 MED ORDER — LIDOCAINE-EPINEPHRINE (PF) 2 %-1:200000 IJ SOLN
INTRAMUSCULAR | Status: DC | PRN
  Administered 2016-10-05: 2 mL via INTRADERMAL
  Administered 2016-10-05: 3 mL via INTRADERMAL
  Administered 2016-10-05 (×3): 5 mL via INTRADERMAL

## 2016-10-05 MED ORDER — MORPHINE SULFATE (PF) 0.5 MG/ML IJ SOLN
INTRAMUSCULAR | Status: AC
  Filled 2016-10-05: qty 10

## 2016-10-05 MED ORDER — NALBUPHINE HCL 10 MG/ML IJ SOLN: 5.0000 mg | INTRAMUSCULAR | Status: DC | PRN

## 2016-10-05 MED ORDER — SCOPOLAMINE 1 MG/3DAYS TD PT72
1.0000 | MEDICATED_PATCH | Freq: Once | TRANSDERMAL | Status: DC
  Administered 2016-10-05: 1.5 mg via TRANSDERMAL

## 2016-10-05 MED ORDER — OXYCODONE HCL 5 MG PO TABS: 5.0000 mg | ORAL_TABLET | ORAL | Status: DC | PRN

## 2016-10-05 MED ORDER — PRENATAL MULTIVITAMIN CH
1.0000 | ORAL_TABLET | Freq: Every day | ORAL | Status: DC
  Administered 2016-10-06: 1 via ORAL
  Filled 2016-10-05: qty 1

## 2016-10-05 MED ORDER — KETOROLAC TROMETHAMINE 30 MG/ML IJ SOLN
30.0000 mg | Freq: Four times a day (QID) | INTRAMUSCULAR | Status: AC | PRN
  Administered 2016-10-05: 30 mg via INTRAMUSCULAR

## 2016-10-05 MED ORDER — SCOPOLAMINE 1 MG/3DAYS TD PT72
MEDICATED_PATCH | TRANSDERMAL | Status: AC
  Filled 2016-10-05: qty 1

## 2016-10-05 MED ORDER — LACTATED RINGERS IV SOLN
INTRAVENOUS | Status: DC
  Administered 2016-10-05: 09:00:00 via INTRAVENOUS

## 2016-10-05 MED ORDER — WITCH HAZEL-GLYCERIN EX PADS: 1.0000 "application " | MEDICATED_PAD | CUTANEOUS | Status: DC | PRN

## 2016-10-05 MED ORDER — PHENYLEPHRINE 40 MCG/ML (10ML) SYRINGE FOR IV PUSH (FOR BLOOD PRESSURE SUPPORT)
PREFILLED_SYRINGE | INTRAVENOUS | Status: AC
  Filled 2016-10-05: qty 10

## 2016-10-05 MED ORDER — DIPHENHYDRAMINE HCL 25 MG PO CAPS
25.0000 mg | ORAL_CAPSULE | Freq: Four times a day (QID) | ORAL | Status: DC | PRN
Start: 2016-10-05 — End: 2016-10-07

## 2016-10-05 MED ORDER — MENTHOL 3 MG MT LOZG: 1.0000 | LOZENGE | OROMUCOSAL | Status: DC | PRN

## 2016-10-05 MED ORDER — IBUPROFEN 800 MG PO TABS
800.0000 mg | ORAL_TABLET | Freq: Three times a day (TID) | ORAL | Status: DC
  Administered 2016-10-05 – 2016-10-07 (×5): 800 mg via ORAL
  Filled 2016-10-05 (×5): qty 1

## 2016-10-05 MED ORDER — ZOLPIDEM TARTRATE 5 MG PO TABS: 5.0000 mg | ORAL_TABLET | Freq: Every evening | ORAL | Status: DC | PRN

## 2016-10-05 MED ORDER — DIPHENHYDRAMINE HCL 50 MG/ML IJ SOLN: 12.5000 mg | INTRAMUSCULAR | Status: DC | PRN

## 2016-10-05 MED ORDER — COCONUT OIL OIL: 1.0000 "application " | TOPICAL_OIL | Status: DC | PRN

## 2016-10-05 MED ORDER — SIMETHICONE 80 MG PO CHEW
80.0000 mg | CHEWABLE_TABLET | ORAL | Status: DC
  Administered 2016-10-05 – 2016-10-06 (×2): 80 mg via ORAL
  Filled 2016-10-05 (×2): qty 1

## 2016-10-05 MED ORDER — NALOXONE HCL 2 MG/2ML IJ SOSY
1.0000 ug/kg/h | PREFILLED_SYRINGE | INTRAVENOUS | Status: DC | PRN
  Filled 2016-10-05: qty 2

## 2016-10-05 MED ORDER — ONDANSETRON HCL 4 MG/2ML IJ SOLN
INTRAMUSCULAR | Status: DC | PRN
  Administered 2016-10-05: 8 mg via INTRAVENOUS

## 2016-10-05 MED ORDER — MISOPROSTOL 25 MCG QUARTER TABLET
ORAL_TABLET | ORAL | Status: DC | PRN
  Administered 2016-10-05: 800 ug via RECTAL

## 2016-10-05 MED ORDER — LACTATED RINGERS IV SOLN
INTRAVENOUS | Status: DC
  Administered 2016-10-05: 09:00:00 via INTRAVENOUS
  Administered 2016-10-05: 125 mL via INTRAVENOUS
  Administered 2016-10-05: 10:00:00 via INTRAVENOUS

## 2016-10-05 MED ORDER — SIMETHICONE 80 MG PO CHEW
80.0000 mg | CHEWABLE_TABLET | Freq: Three times a day (TID) | ORAL | Status: DC
  Administered 2016-10-05 – 2016-10-07 (×5): 80 mg via ORAL
  Filled 2016-10-05 (×5): qty 1

## 2016-10-05 MED ORDER — DEXAMETHASONE SODIUM PHOSPHATE 4 MG/ML IJ SOLN
INTRAMUSCULAR | Status: DC | PRN
  Administered 2016-10-05: 4 mg via INTRAVENOUS

## 2016-10-05 MED ORDER — SENNOSIDES-DOCUSATE SODIUM 8.6-50 MG PO TABS
2.0000 | ORAL_TABLET | ORAL | Status: DC
  Administered 2016-10-05: 2 via ORAL
  Filled 2016-10-05 (×2): qty 2

## 2016-10-05 MED ORDER — FENTANYL CITRATE (PF) 100 MCG/2ML IJ SOLN: 25.0000 ug | INTRAMUSCULAR | Status: DC | PRN

## 2016-10-05 MED ORDER — DEXAMETHASONE SODIUM PHOSPHATE 4 MG/ML IJ SOLN
INTRAMUSCULAR | Status: AC
  Filled 2016-10-05: qty 1

## 2016-10-05 MED ORDER — OXYTOCIN 10 UNIT/ML IJ SOLN
INTRAVENOUS | Status: DC | PRN
  Administered 2016-10-05: 40 [IU] via INTRAVENOUS

## 2016-10-05 MED ORDER — ACETAMINOPHEN 325 MG PO TABS: 650.0000 mg | ORAL_TABLET | ORAL | Status: DC | PRN

## 2016-10-05 MED ORDER — KETOROLAC TROMETHAMINE 30 MG/ML IJ SOLN: 30.0000 mg | Freq: Four times a day (QID) | INTRAMUSCULAR | Status: AC | PRN

## 2016-10-05 MED ORDER — PHENYLEPHRINE 8 MG IN D5W 100 ML (0.08MG/ML) PREMIX OPTIME
INJECTION | INTRAVENOUS | Status: AC
  Filled 2016-10-05: qty 100

## 2016-10-05 MED ORDER — ONDANSETRON HCL 4 MG/2ML IJ SOLN
INTRAMUSCULAR | Status: AC
  Filled 2016-10-05: qty 2

## 2016-10-05 MED ORDER — OXYTOCIN 10 UNIT/ML IJ SOLN
INTRAMUSCULAR | Status: AC
  Filled 2016-10-05: qty 4

## 2016-10-05 MED ORDER — MISOPROSTOL 200 MCG PO TABS
ORAL_TABLET | ORAL | Status: AC
  Filled 2016-10-05: qty 4

## 2016-10-05 MED ORDER — OXYTOCIN 40 UNITS IN LACTATED RINGERS INFUSION - SIMPLE MED: 2.5000 [IU]/h | INTRAVENOUS | Status: AC

## 2016-10-05 MED ORDER — CEFAZOLIN SODIUM 10 G IJ SOLR: 3.0000 g | INTRAMUSCULAR | Status: DC

## 2016-10-05 MED ORDER — FENTANYL CITRATE (PF) 100 MCG/2ML IJ SOLN
INTRAMUSCULAR | Status: AC
  Filled 2016-10-05: qty 2

## 2016-10-05 MED ORDER — ONDANSETRON HCL 4 MG/2ML IJ SOLN: 4.0000 mg | Freq: Three times a day (TID) | INTRAMUSCULAR | Status: DC | PRN

## 2016-10-05 MED ORDER — MEPERIDINE HCL 25 MG/ML IJ SOLN
INTRAMUSCULAR | Status: AC
  Filled 2016-10-05: qty 1

## 2016-10-05 MED ORDER — CEFAZOLIN SODIUM-DEXTROSE 2-4 GM/100ML-% IV SOLN
2.0000 g | INTRAVENOUS | Status: AC
  Administered 2016-10-05: 2 g via INTRAVENOUS

## 2016-10-05 MED ORDER — LACTATED RINGERS IV SOLN: INTRAVENOUS | Status: DC

## 2016-10-05 MED ORDER — SODIUM CHLORIDE 0.9% FLUSH: 3.0000 mL | INTRAVENOUS | Status: DC | PRN

## 2016-10-05 MED ORDER — SIMETHICONE 80 MG PO CHEW: 80.0000 mg | CHEWABLE_TABLET | ORAL | Status: DC | PRN

## 2016-10-05 MED ORDER — MEPERIDINE HCL 25 MG/ML IJ SOLN
INTRAMUSCULAR | Status: DC | PRN
Start: 2016-10-05 — End: 2016-10-05
  Administered 2016-10-05: 12.5 mg via INTRAVENOUS

## 2016-10-05 MED ORDER — DIBUCAINE 1 % RE OINT: 1.0000 "application " | TOPICAL_OINTMENT | RECTAL | Status: DC | PRN

## 2016-10-05 MED ORDER — DIPHENHYDRAMINE HCL 25 MG PO CAPS: 25.0000 mg | ORAL_CAPSULE | ORAL | Status: DC | PRN

## 2016-10-05 SURGICAL SUPPLY — 37 items
BENZOIN TINCTURE PRP APPL 2/3 (GAUZE/BANDAGES/DRESSINGS) ×3 IMPLANT
CHLORAPREP W/TINT 26ML (MISCELLANEOUS) ×3 IMPLANT
CLAMP CORD UMBIL (MISCELLANEOUS) IMPLANT
CLOSURE WOUND 1/2 X4 (GAUZE/BANDAGES/DRESSINGS) ×1
CLOTH BEACON ORANGE TIMEOUT ST (SAFETY) ×3 IMPLANT
CONTAINER PREFILL 10% NBF 15ML (MISCELLANEOUS) IMPLANT
DRAPE C SECTION CLR SCREEN (DRAPES) ×3 IMPLANT
DRSG OPSITE POSTOP 4X10 (GAUZE/BANDAGES/DRESSINGS) ×3 IMPLANT
ELECT REM PT RETURN 9FT ADLT (ELECTROSURGICAL) ×3
ELECTRODE REM PT RTRN 9FT ADLT (ELECTROSURGICAL) ×1 IMPLANT
EXTRACTOR VACUUM M CUP 4 TUBE (SUCTIONS) ×2 IMPLANT
EXTRACTOR VACUUM M CUP 4' TUBE (SUCTIONS) ×1
GLOVE BIO SURGEON STRL SZ 6.5 (GLOVE) ×2 IMPLANT
GLOVE BIO SURGEONS STRL SZ 6.5 (GLOVE) ×1
GLOVE BIOGEL PI IND STRL 7.0 (GLOVE) ×1 IMPLANT
GLOVE BIOGEL PI INDICATOR 7.0 (GLOVE) ×2
GOWN STRL REUS W/TWL LRG LVL3 (GOWN DISPOSABLE) ×6 IMPLANT
KIT ABG SYR 3ML LUER SLIP (SYRINGE) IMPLANT
NEEDLE HYPO 25X5/8 SAFETYGLIDE (NEEDLE) IMPLANT
NS IRRIG 1000ML POUR BTL (IV SOLUTION) ×3 IMPLANT
PACK C SECTION WH (CUSTOM PROCEDURE TRAY) ×3 IMPLANT
PAD OB MATERNITY 4.3X12.25 (PERSONAL CARE ITEMS) ×3 IMPLANT
PENCIL SMOKE EVAC W/HOLSTER (ELECTROSURGICAL) ×3 IMPLANT
RTRCTR C-SECT PINK 25CM LRG (MISCELLANEOUS) ×3 IMPLANT
STRIP CLOSURE SKIN 1/2X4 (GAUZE/BANDAGES/DRESSINGS) ×2 IMPLANT
SUT MNCRL 0 VIOLET CTX 36 (SUTURE) ×2 IMPLANT
SUT MONOCRYL 0 CTX 36 (SUTURE) ×4
SUT PLAIN 1 NONE 54 (SUTURE) IMPLANT
SUT PLAIN 2 0 XLH (SUTURE) ×3 IMPLANT
SUT VIC AB 0 CT1 27 (SUTURE) ×4
SUT VIC AB 0 CT1 27XBRD ANBCTR (SUTURE) ×2 IMPLANT
SUT VIC AB 2-0 CT1 27 (SUTURE) ×2
SUT VIC AB 2-0 CT1 TAPERPNT 27 (SUTURE) ×1 IMPLANT
SUT VIC AB 4-0 KS 27 (SUTURE) ×3 IMPLANT
SYR BULB IRRIGATION 50ML (SYRINGE) ×3 IMPLANT
TOWEL OR 17X24 6PK STRL BLUE (TOWEL DISPOSABLE) ×3 IMPLANT
TRAY FOLEY BAG SILVER LF 14FR (SET/KITS/TRAYS/PACK) ×3 IMPLANT

## 2016-10-05 NOTE — Anesthesia Procedure Notes (Signed)
Epidural Patient location during procedure: OR  Staffing Anesthesiologist: Marcene DuosFITZGERALD, Namon Villarin Performed: anesthesiologist   Preanesthetic Checklist Completed: patient identified, site marked, surgical consent, pre-op evaluation, timeout performed, IV checked, risks and benefits discussed and monitors and equipment checked  Epidural Patient position: sitting Prep: site prepped and draped and DuraPrep Patient monitoring: continuous pulse ox and blood pressure Approach: midline Location: L4-L5 Injection technique: LOR air  Needle:  Needle type: Tuohy  Needle gauge: 17 G Needle length: 9 cm and 9 Needle insertion depth: 9 cm Catheter type: closed end flexible Catheter size: 19 Gauge Catheter at skin depth: 15 cm Test dose: negative  Assessment Sensory level: T8 Events: blood not aspirated, injection not painful, no injection resistance, negative IV test and no paresthesia  Additional Notes CSE performed at request of surgeon due to repeat x4 and BTL. LOR at 9cm to air. Easy pass spinal needle 24G pencan. Free flowing CSF. Aspiration before and after injection LA. Catheter threaded easily. SAB dose failed however and epidural used successfully for c-section.

## 2016-10-05 NOTE — Anesthesia Preprocedure Evaluation (Signed)
Anesthesia Evaluation  Patient identified by MRN, date of birth, ID band Patient awake    Reviewed: Allergy & Precautions, NPO status , Patient's Chart, lab work & pertinent test results  Airway Mallampati: II  TM Distance: >3 FB Neck ROM: Full    Dental   Pulmonary neg pulmonary ROS,    breath sounds clear to auscultation       Cardiovascular negative cardio ROS   Rhythm:Regular Rate:Normal     Neuro/Psych  Headaches,    GI/Hepatic Neg liver ROS, PUD,   Endo/Other  negative endocrine ROS  Renal/GU negative Renal ROS     Musculoskeletal   Abdominal   Peds  Hematology negative hematology ROS (+)   Anesthesia Other Findings   Reproductive/Obstetrics (+) Pregnancy                             Lab Results  Component Value Date   WBC 11.1 (H) 10/04/2016   HGB 12.5 10/04/2016   HCT 37.0 10/04/2016   MCV 92.3 10/04/2016   PLT 215 10/04/2016   Lab Results  Component Value Date   CREATININE 0.64 10/04/2016   BUN 6 10/04/2016   NA 136 10/04/2016   K 3.7 10/04/2016   CL 106 10/04/2016   CO2 21 (L) 10/04/2016    Anesthesia Physical Anesthesia Plan  ASA: III  Anesthesia Plan: Spinal   Post-op Pain Management:    Induction:   PONV Risk Score and Plan: 3 and Ondansetron, Scopolamine patch - Pre-op, Metaclopromide and Treatment [place] vary due to age or medical condition  Airway Management Planned: Natural Airway  Additional Equipment:   Intra-op Plan:   Post-operative Plan:   Informed Consent: I have reviewed the patients History and Physical, chart, labs and discussed the procedure including the risks, benefits and alternatives for the proposed anesthesia with the patient or authorized representative who has indicated his/her understanding and acceptance.     Plan Discussed with: CRNA  Anesthesia Plan Comments:         Anesthesia Quick Evaluation

## 2016-10-05 NOTE — Transfer of Care (Signed)
Immediate Anesthesia Transfer of Care Note  Patient: [name]  Procedure(s) Performed: Procedure(s) with comments: CESAREAN SECTION WITH BILATERAL TUBAL LIGATION (Bilateral) - Tracey RNFA   Patient Location: PACU  Anesthesia Type:Spinal and Epidural  Level of Consciousness: awake, alert  and oriented  Airway & Oxygen Therapy: Patient Spontanous Breathing  Post-op Assessment: Report given to RN and Post -op Vital signs reviewed and stable  Post vital signs: Reviewed and stable  Last Vitals:  Vitals:   10/05/16 0710  BP: 129/73  Pulse: 86  Resp: 20  Temp: 36.6 C    Last Pain:  Vitals:   10/05/16 0710  TempSrc: Oral         Complications: No apparent anesthesia complications

## 2016-10-05 NOTE — Anesthesia Postprocedure Evaluation (Signed)
Anesthesia Post Note  Patient: [name]  Procedure(s) Performed: Procedure(s) (LRB): CESAREAN SECTION WITH BILATERAL TUBAL LIGATION (Bilateral)     Patient location during evaluation: PACU Anesthesia Type: Epidural Level of consciousness: awake and alert Pain management: pain level controlled Vital Signs Assessment: post-procedure vital signs reviewed and stable Respiratory status: spontaneous breathing and respiratory function stable Cardiovascular status: blood pressure returned to baseline and stable Postop Assessment: epidural receding Anesthetic complications: no    Last Vitals:  Vitals:   10/05/16 1300 10/05/16 1405  BP: 129/66 125/68  Pulse: 62 68  Resp: 18 18  Temp: 37.2 C 37 C  SpO2: 95% 95%    Last Pain:  Vitals:   10/05/16 1405  TempSrc: Axillary  PainSc:    Pain Goal:                 Kennieth RadFitzgerald, Jenah Vanasten E

## 2016-10-05 NOTE — Brief Op Note (Signed)
10/05/2016  10:27 AM  PATIENT:  [name]  31 y.o. female  PRE-OPERATIVE DIAGNOSIS:   Repeat C-Section x3 Possible BTL  POST-OPERATIVE DIAGNOSIS:  Repeat C-Section x3 BTL (B salpingectomy)  PROCEDURE:  Procedure(s) with comments: CESAREAN SECTION WITH BILATERAL TUBAL LIGATION (Bilateral) - Tracey RNFA   SURGEON:  Surgeon(s) and Role:    * Bovard-Stuckert, Makalynn Berwanger, MD - Primary  ASSISTANTS: Genice Rougeucker, Tracey RNFA   ANESTHESIA:   CSE  EBL:  Total I/O In: 3200 [I.V.:3200] Out: 605 [Urine:100; Blood:505]  FINDINGS: viable female infant at 9:26, apgars 8/8, wt P; nl uterus, tubes and ovaries.    BLOOD ADMINISTERED:none  DRAINS: Urinary Catheter (Foley)   LOCAL MEDICATIONS USED:  NONE  SPECIMEN:  Source of Specimen:  B tubal segments, Placenta  DISPOSITION OF SPECIMEN:  Pathology and L&D  COUNTS:  YES  TOURNIQUET:  * No tourniquets in log *  DICTATION: .Other Dictation: Dictation Number [id number]  PLAN OF CARE: Admit to inpatient   PATIENT DISPOSITION:  PACU - hemodynamically stable.   Delay start of Pharmacological VTE agent (>24hrs) due to surgical blood loss or risk of bleeding: not applicable

## 2016-10-05 NOTE — Anesthesia Postprocedure Evaluation (Signed)
Anesthesia Post Note  Patient: [name]  Procedure(s) Performed: Procedure(s) (LRB): CESAREAN SECTION WITH BILATERAL TUBAL LIGATION (Bilateral)     Patient location during evaluation: Mother Baby Anesthesia Type: Spinal Level of consciousness: awake and alert Vital Signs Assessment: post-procedure vital signs reviewed and stable Respiratory status: spontaneous breathing Cardiovascular status: blood pressure returned to baseline Postop Assessment: no headache, patient able to bend at knees, no backache, no signs of nausea or vomiting, adequate PO intake and spinal receding Anesthetic complications: no    Last Vitals:  Vitals:   10/05/16 1300 10/05/16 1405  BP: 129/66 125/68  Pulse: 62 68  Resp: 18 18  Temp: 37.2 C 37 C  SpO2: 95% 95%    Last Pain:  Vitals:   10/05/16 1405  TempSrc: Axillary  PainSc:    Pain Goal:                 Salome ArntSterling, Jarrick Fjeld Marie

## 2016-10-05 NOTE — Addendum Note (Signed)
Addendum  created 10/05/16 1544 by Armanda HeritageSterling, Armin Yerger M, CRNA   Sign clinical note

## 2016-10-06 ENCOUNTER — Encounter (HOSPITAL_COMMUNITY): Payer: Self-pay | Admitting: Obstetrics and Gynecology

## 2016-10-06 LAB — CBC
HCT: 30.4 % — ABNORMAL LOW (ref 36.0–46.0)
Hemoglobin: 10.3 g/dL — ABNORMAL LOW (ref 12.0–15.0)
MCH: 31.4 pg (ref 26.0–34.0)
MCHC: 33.9 g/dL (ref 30.0–36.0)
MCV: 92.7 fL (ref 78.0–100.0)
Platelets: 174 10*3/uL (ref 150–400)
RBC: 3.28 MIL/uL — ABNORMAL LOW (ref 3.87–5.11)
RDW: 13.9 % (ref 11.5–15.5)
WBC: 11.8 10*3/uL — ABNORMAL HIGH (ref 4.0–10.5)

## 2016-10-06 NOTE — Lactation Note (Signed)
This note was copied from a baby's chart. Lactation Consultation Note Experienced BF mom has 10, 5, and 30 yr old children. The 30 yr old mom BF the most 13 months. Had one clogged duct w/the last child, quickly resolved in shower. No further issues.  Mom states BF going well. Mom encouraged to feed baby 8-12 times/24 hours and with feeding cues. Reviewed I&O, STS, cluster feeding, newborn behavior. Encouraged to call for assistance or questions. [facility] brochure given w/resources, support groups and LC services. Patient Name: [name] LeepBoy [name] Today's Date: 10/06/2016 Reason for consult: Initial assessment   Maternal Data Has patient been taught Hand Expression?: Yes Does the patient have breastfeeding experience prior to this delivery?: Yes  Feeding Feeding Type: Breast Fed  LATCH Score                   Interventions    Lactation Tools Discussed/Used     Consult Status Consult Status: Follow-up Date: 10/07/16 Follow-up type: In-patient    Charyl DancerCARVER, Skyylar Kopf G 10/06/2016, 5:18 AM

## 2016-10-06 NOTE — Progress Notes (Signed)
Subjective: Postpartum Day 1: Cesarean Delivery Patient reports incisional pain and tolerating PO.  Nl lochia, pain controlled  Objective: Vital signs in last 24 hours: Temp:  [97.8 F (36.6 C)-99 F (37.2 C)] 98.6 F (37 C) (09/13 0522) Pulse Rate:  [51-108] 55 (09/13 0522) Resp:  [11-24] 14 (09/13 0522) BP: (95-131)/(51-70) 127/57 (09/13 0522) SpO2:  [95 %-99 %] 97 % (09/13 0148)  Physical Exam:  General: alert and no distress Lochia: appropriate Uterine Fundus: firm Incision: healing well DVT Evaluation: No evidence of DVT seen on physical exam.   Recent Labs  10/04/16 1009 10/06/16 0601  HGB 12.5 10.3*  HCT 37.0 30.4*    Assessment/Plan: Status post Cesarean section. Doing well postoperatively.  Continue current care - Routine PP care.  [name] 10/06/2016, 7:45 AM

## 2016-10-06 NOTE — Op Note (Deleted)
  The note originally documented on this encounter has been moved the the encounter in which it belongs.  

## 2016-10-06 NOTE — Op Note (Signed)
[name], [name]                  ACCOUNT NO.:  [id number]  MEDICAL RECORD NO.:  [id number]  LOCATION:                                FACILITY:  [facility]  PHYSICIAN:  Sherron Monday, MD        DATE OF BIRTH:  1985/01/31  DATE OF PROCEDURE:  10/05/2016 DATE OF DISCHARGE:  10/07/2016                              OPERATIVE REPORT   PREOPERATIVE DIAGNOSIS:  Intrauterine pregnancy at term, history of low- transverse cesarean section x3, undesired fertility.  POSTOPERATIVE DIAGNOSIS:  Intrauterine pregnancy at term, history of low- transverse cesarean section x3, undesired fertility, delivered, status post bilateral salpingectomy.  PROCEDURE:  Repeat low-transverse cesarean section with bilateral tubal ligation by bilateral salpingectomy.  SURGEON:  Sherron Monday, MD.  ASSISTANT:  [name], RNFA.  ANESTHESIA:  Combined spinal epidural.  IV FLUIDS:  3200 mL.  URINE OUTPUT:  100 mL clear urine at the end of the procedure.  ESTIMATED BLOOD LOSS:  Approximately 500 mL.  FINDINGS:  Viable female infant at 9:26 a.m. with Apgars of 8 at 1 minute, 8 at 5 minutes and weight pending at the time of dictation.  Normal postpartum uterus, tubes, and ovaries are noted.  COMPLICATIONS:  None.  PATHOLOGY:  Bilateral tubal segments to Pathology.  Placenta to Labor and Delivery.  DESCRIPTION OF PROCEDURE:  After informed consent was reviewed with the patient and her spouse, she was transported to the operating room, sat on the edge of the table and combined spinal epidural anesthesia was placed.  There was some delay with the spinal being effective; therefore, the epidural was dosed and used for this case.  She was returned to the supine position with a leftward tilt.  She was prepped and draped in the normal sterile fashion.  A Pfannenstiel skin incision was made at the level of her previous incision, carried through the underlying layer of fascia sharply.  The fascia was incised in the midline.   The incision was extended laterally with Mayo scissors.  The superior aspect of the fascial incision was grasped with Kocher clamps, elevating the rectus muscles.  They were dissected off both bluntly and sharply.  Attention was turned to identifying the midline, somewhat difficult due to some scarring, however, midline was identified and the peritoneum was entered sharply.  The incision was extended superiorly and inferiorly with good visualization of the bladder.  An Alexis skin retractor was placed carefully making sure that no bowel was entrapped. The uterus was incised in a transverse fashion and the incision was extended manually.  Infant was delivered with the aid of a vacuum from vertex presentation.  Nose and mouth were suctioned on the field.  At the time of rupture, moderate meconium was noted.  The cord was awaited a minute to be cut.  The baby had been stimulated and bulb suctioned and had began to cry.  The infant was shown to the parents.  The cord was clamped and cut after a minute and handed to the awaiting pediatric staff.  The placenta was attempted to be expressed and had to be manually removed.  It was removed in entirety.  The membranes were  also removed.  The uterus was cleared of all clot and debris and membranes. The uterine incision was closed with 2 layers of 0 Monocryl, the first of which was running locked and the second as an imbricating layer.  It was noted to be hemostatic.  It was again confirmed that the patient wanted to proceed with a tubal ligation by salpingectomy.  The left tube was initially identified, followed out to the fimbriated end and isolated using a Babcock and a [employer].  It was excised with Metzenbaum scissors and the remaining pedicle was made.  It was ligated with 2 sutures of plain gut and was noted to be hemostatic.  In a similar fashion, the right tube was identified, followed out to the fimbriated end and excised with the Metzenbaum  after being held with a Tresa EndoKelly.  It was doubly ligated with plain gut and again noted to be hemostatic.  The uterine incision was again inspected and found to be hemostatic.  The sutures were cut.  The peritoneum was reapproximated using 2-0 Vicryl. The fascia was reapproximated with 0 Vicryl from either corner overlapping in the midline.  The subcuticular adipose layer was made hemostatic with Bovie cautery.  The dead space was closed with plain gut.  The skin was closed subcuticularly with 4-0 Vicryl.  Benzoin and Steri-Strips applied.  The patient tolerated the procedure well. Sponge, lap, and needle counts were correct x2 per the operating staff.     Sherron MondayJody Bovard, MD     JB/MEDQ  D:  10/05/2016  T:  10/06/2016  Job:  [id number]

## 2016-10-07 MED ORDER — IBUPROFEN 800 MG PO TABS: 800.0000 mg | ORAL_TABLET | Freq: Three times a day (TID) | ORAL | 0 refills | Status: AC

## 2016-10-07 NOTE — Progress Notes (Signed)
Post discharge chart review completed.  

## 2016-10-07 NOTE — Discharge Instructions (Signed)
As per discharge pamphlet °

## 2016-10-07 NOTE — Discharge Summary (Signed)
OB Discharge Summary     Patient Name: [name] DOB: [birth year] MRN: [id number]  Date of admission: 10/05/2016 Delivering MD: Sherian Rein   Date of discharge: 10/07/2016  Admitting diagnosis: Repeat C-Section x3 Possible BTL Intrauterine pregnancy: [redacted]w[redacted]d     Secondary diagnosis:  Principal Problem:   Normal pregnancy in multigravida in third trimester Active Problems:   S/P cesarean section   S/P tubal ligation   Status post repeat low transverse cesarean section      Discharge diagnosis: Term Pregnancy Delivered                                  Hospital course:  Sceduled C/S   31 y.o. yo [id number] at [redacted]w[redacted]d was admitted to the hospital 10/05/2016 for scheduled cesarean section with the following indication:Elective Repeat.  Membrane Rupture Time/Date: 9:25 AM ,10/05/2016   Patient delivered a Viable infant.10/05/2016  Details of operation can be found in separate operative note.  Pateint had an uncomplicated postpartum course.  She is ambulating, tolerating a regular diet, passing flatus, and urinating well. Patient is discharged home in stable condition on  10/07/16         Physical exam  Vitals:   10/06/16 0148 10/06/16 0522 10/06/16 1807 10/07/16 0652  BP: 131/69 (!) 127/57 119/64 132/77  Pulse: 62 (!) 55 61 65  Resp: Temp: 98.7 F (37.1 C) 98.6 F (37 C) 98.1 F (36.7 C) 98.4 F (36.9 C)  TempSrc: Oral Oral Oral Oral  SpO2: 97%     Weight:      Height:       General: alert Lochia: appropriate Uterine Fundus: firm Incision: Healing well with no significant drainage  Labs: Lab Results  Component Value Date   WBC 11.8 (H) 10/06/2016   HGB 10.3 (L) 10/06/2016   HCT 30.4 (L) 10/06/2016   MCV 92.7 10/06/2016   PLT 174 10/06/2016   CMP Latest Ref Rng & Units 10/04/2016  Glucose 65 - 99 mg/dL 99  BUN 6 - 20 mg/dL 6  Creatinine 1.47 - 8.29 mg/dL 5.62  Sodium 130 - 865 mmol/L 136  Potassium 3.5 - 5.1 mmol/L 3.7  Chloride 101 - 111 mmol/L  106  CO2 22 - 32 mmol/L 21(L)  Calcium 8.9 - 10.3 mg/dL 9.0  Total Protein 6.5 - 8.1 g/dL 7.0  Total Bilirubin 0.3 - 1.2 mg/dL 0.4  Alkaline Phos 38 - 126 U/L 123  AST 15 - 41 U/L 35  ALT 14 - 54 U/L 39    Discharge instruction: per After Visit Summary and "Baby and Me Booklet".  After visit meds:  Allergies as of 10/07/2016   No Known Allergies     Medication List    TAKE these medications   acetaminophen 325 MG tablet Commonly known as:  TYLENOL Take 325-650 mg by mouth every 6 (six) hours as needed (FOR PAIN/HEADACHES.).   ibuprofen 800 MG tablet Commonly known as:  ADVIL,MOTRIN Take 1 tablet (800 mg total) by mouth every 8 (eight) hours.   prenatal multivitamin Tabs tablet Take 1 tablet by mouth daily. What changed:  when to take this            Discharge Care Instructions        Start     Ordered   10/07/16 0000  ibuprofen (ADVIL,MOTRIN) 800 MG tablet  Every 8 hours  10/07/16 0820   10/07/16 0000  Diet - low sodium heart healthy     10/07/16 0820   10/07/16 0000  Lifting restrictions    Comments:  Weight restriction of 10 lbs.   10/07/16 0820   10/05/16 0000  OB RESULTS CONSOLE Rubella Antibody    Comments:  This external order was created through the Results Console.    10/05/16 0844   10/05/16 0000  OB RESULTS CONSOLE Hepatitis B surface antigen    Comments:  This external order was created through the Results Console.    10/05/16 0844      Diet: routine diet  Activity: Advance as tolerated. Pelvic rest for 6 weeks.   Outpatient follow up:2 weeks Follow up Appt:No future appointments. Follow up Visit:No Follow-up on file.  Postpartum contraception: Tubal Ligation  Newborn Data: Live born female  Birth Weight: 6 lb 10.2 oz (3010 g) APGAR: 8, 8  Baby Feeding: Breast Disposition:home with mother   10/07/2016 Zenaida Niece, MD

## 2016-10-07 NOTE — Progress Notes (Signed)
Patient ID: [name], female   DOB: 03-Nov-1985, 31 y.o.   MRN: [id number] POD #2 Doing well, minimal pain Afeb, VSS Abd- soft, fundus firm, incision intact D/c home
